# Patient Record
Sex: Female | Born: 1970 | Race: Black or African American | Hispanic: No | Marital: Married | Smoking: Never smoker
Health system: Southern US, Community
[De-identification: ages and names within clinical notes are randomized; demographics above are authoritative.]

## PROBLEM LIST (undated history)

## (undated) DIAGNOSIS — IMO0002 Reserved for concepts with insufficient information to code with codable children: Secondary | ICD-10-CM

## (undated) DIAGNOSIS — I471 Supraventricular tachycardia, unspecified: Secondary | ICD-10-CM

## (undated) DIAGNOSIS — N920 Excessive and frequent menstruation with regular cycle: Secondary | ICD-10-CM

## (undated) DIAGNOSIS — N946 Dysmenorrhea, unspecified: Secondary | ICD-10-CM

## (undated) DIAGNOSIS — R51 Headache: Secondary | ICD-10-CM

## (undated) DIAGNOSIS — I10 Essential (primary) hypertension: Secondary | ICD-10-CM

## (undated) DIAGNOSIS — O021 Missed abortion: Secondary | ICD-10-CM

## (undated) DIAGNOSIS — Z8669 Personal history of other diseases of the nervous system and sense organs: Secondary | ICD-10-CM

## (undated) DIAGNOSIS — K219 Gastro-esophageal reflux disease without esophagitis: Secondary | ICD-10-CM

## (undated) DIAGNOSIS — B019 Varicella without complication: Secondary | ICD-10-CM

## (undated) DIAGNOSIS — D649 Anemia, unspecified: Secondary | ICD-10-CM

## (undated) DIAGNOSIS — R300 Dysuria: Secondary | ICD-10-CM

## (undated) HISTORY — PX: DILATION AND CURETTAGE OF UTERUS: SHX78

## (undated) HISTORY — DX: Dysmenorrhea, unspecified: N94.6

## (undated) HISTORY — DX: Supraventricular tachycardia, unspecified: I47.10

## (undated) HISTORY — DX: Dysuria: R30.0

## (undated) HISTORY — DX: Supraventricular tachycardia: I47.1

## (undated) HISTORY — DX: Varicella without complication: B01.9

## (undated) HISTORY — PX: OTHER SURGICAL HISTORY: SHX169

## (undated) HISTORY — DX: Gastro-esophageal reflux disease without esophagitis: K21.9

## (undated) HISTORY — DX: Anemia, unspecified: D64.9

## (undated) HISTORY — DX: Excessive and frequent menstruation with regular cycle: N92.0

## (undated) HISTORY — PX: UPPER GASTROINTESTINAL ENDOSCOPY: SHX188

## (undated) HISTORY — DX: Personal history of other diseases of the nervous system and sense organs: Z86.69

## (undated) HISTORY — DX: Essential (primary) hypertension: I10

## (undated) HISTORY — PX: BACK SURGERY: SHX140

---
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
## 1988-10-14 DIAGNOSIS — R87619 Unspecified abnormal cytological findings in specimens from cervix uteri: Secondary | ICD-10-CM | POA: Insufficient documentation

## 1990-10-14 HISTORY — PX: APPENDECTOMY: SHX54

## 1998-07-05 ENCOUNTER — Emergency Department (HOSPITAL_COMMUNITY): Admission: EM | Admit: 1998-07-05 | Discharge: 1998-07-05 | Payer: Self-pay | Admitting: Emergency Medicine

## 1998-09-19 ENCOUNTER — Emergency Department (HOSPITAL_COMMUNITY): Admission: EM | Admit: 1998-09-19 | Discharge: 1998-09-20 | Payer: Self-pay | Admitting: Emergency Medicine

## 1998-11-04 ENCOUNTER — Emergency Department (HOSPITAL_COMMUNITY): Admission: EM | Admit: 1998-11-04 | Discharge: 1998-11-04 | Payer: Self-pay | Admitting: Emergency Medicine

## 1999-02-05 ENCOUNTER — Other Ambulatory Visit: Admission: RE | Admit: 1999-02-05 | Discharge: 1999-02-05 | Payer: Self-pay | Admitting: Obstetrics and Gynecology

## 1999-02-17 ENCOUNTER — Encounter: Payer: Self-pay | Admitting: Obstetrics & Gynecology

## 1999-02-17 ENCOUNTER — Ambulatory Visit (HOSPITAL_COMMUNITY): Admission: AD | Admit: 1999-02-17 | Discharge: 1999-02-17 | Payer: Self-pay | Admitting: Obstetrics and Gynecology

## 1999-09-28 ENCOUNTER — Emergency Department (HOSPITAL_COMMUNITY): Admission: EM | Admit: 1999-09-28 | Discharge: 1999-09-28 | Payer: Self-pay | Admitting: Emergency Medicine

## 1999-11-10 ENCOUNTER — Emergency Department (HOSPITAL_COMMUNITY): Admission: EM | Admit: 1999-11-10 | Discharge: 1999-11-10 | Payer: Self-pay | Admitting: Internal Medicine

## 2000-01-14 ENCOUNTER — Encounter: Payer: Self-pay | Admitting: Obstetrics and Gynecology

## 2000-01-14 ENCOUNTER — Encounter: Admission: RE | Admit: 2000-01-14 | Discharge: 2000-01-14 | Payer: Self-pay | Admitting: Obstetrics and Gynecology

## 2000-04-03 ENCOUNTER — Inpatient Hospital Stay (HOSPITAL_COMMUNITY): Admission: AD | Admit: 2000-04-03 | Discharge: 2000-04-03 | Payer: Self-pay | Admitting: Obstetrics and Gynecology

## 2000-05-30 ENCOUNTER — Inpatient Hospital Stay (HOSPITAL_COMMUNITY): Admission: EM | Admit: 2000-05-30 | Discharge: 2000-06-01 | Payer: Self-pay | Admitting: Emergency Medicine

## 2000-05-30 ENCOUNTER — Encounter: Payer: Self-pay | Admitting: Surgery

## 2000-05-30 ENCOUNTER — Encounter: Payer: Self-pay | Admitting: Emergency Medicine

## 2000-05-31 ENCOUNTER — Encounter: Payer: Self-pay | Admitting: Surgery

## 2000-06-20 ENCOUNTER — Other Ambulatory Visit: Admission: RE | Admit: 2000-06-20 | Discharge: 2000-06-20 | Payer: Self-pay | Admitting: Obstetrics & Gynecology

## 2001-01-14 ENCOUNTER — Encounter: Admission: RE | Admit: 2001-01-14 | Discharge: 2001-01-14 | Payer: Self-pay | Admitting: Obstetrics and Gynecology

## 2001-01-14 ENCOUNTER — Encounter: Payer: Self-pay | Admitting: Obstetrics and Gynecology

## 2001-03-10 ENCOUNTER — Encounter: Payer: Self-pay | Admitting: Thoracic Surgery

## 2001-03-10 ENCOUNTER — Encounter: Admission: RE | Admit: 2001-03-10 | Discharge: 2001-03-10 | Payer: Self-pay | Admitting: Thoracic Surgery

## 2001-05-11 ENCOUNTER — Other Ambulatory Visit: Admission: RE | Admit: 2001-05-11 | Discharge: 2001-05-11 | Payer: Self-pay | Admitting: Obstetrics and Gynecology

## 2002-10-13 ENCOUNTER — Encounter: Payer: Self-pay | Admitting: Internal Medicine

## 2002-10-13 LAB — CONVERTED CEMR LAB

## 2002-12-16 ENCOUNTER — Encounter: Admission: RE | Admit: 2002-12-16 | Discharge: 2002-12-16 | Payer: Self-pay | Admitting: Internal Medicine

## 2002-12-16 ENCOUNTER — Encounter: Payer: Self-pay | Admitting: Internal Medicine

## 2003-04-17 ENCOUNTER — Encounter: Payer: Self-pay | Admitting: Emergency Medicine

## 2003-04-17 ENCOUNTER — Emergency Department (HOSPITAL_COMMUNITY): Admission: EM | Admit: 2003-04-17 | Discharge: 2003-04-17 | Payer: Self-pay | Admitting: Emergency Medicine

## 2003-07-29 ENCOUNTER — Emergency Department (HOSPITAL_COMMUNITY): Admission: EM | Admit: 2003-07-29 | Discharge: 2003-07-29 | Payer: Self-pay | Admitting: Emergency Medicine

## 2003-07-29 ENCOUNTER — Encounter: Payer: Self-pay | Admitting: Emergency Medicine

## 2003-08-01 ENCOUNTER — Emergency Department (HOSPITAL_COMMUNITY): Admission: EM | Admit: 2003-08-01 | Discharge: 2003-08-02 | Payer: Self-pay | Admitting: Emergency Medicine

## 2003-08-06 ENCOUNTER — Emergency Department (HOSPITAL_COMMUNITY): Admission: AD | Admit: 2003-08-06 | Discharge: 2003-08-06 | Payer: Self-pay | Admitting: Family Medicine

## 2003-08-13 ENCOUNTER — Emergency Department (HOSPITAL_COMMUNITY): Admission: EM | Admit: 2003-08-13 | Discharge: 2003-08-14 | Payer: Self-pay | Admitting: Emergency Medicine

## 2003-08-14 ENCOUNTER — Emergency Department (HOSPITAL_COMMUNITY): Admission: EM | Admit: 2003-08-14 | Discharge: 2003-08-14 | Payer: Self-pay | Admitting: Emergency Medicine

## 2003-10-16 ENCOUNTER — Emergency Department (HOSPITAL_COMMUNITY): Admission: EM | Admit: 2003-10-16 | Discharge: 2003-10-16 | Payer: Self-pay | Admitting: Emergency Medicine

## 2003-11-22 ENCOUNTER — Emergency Department (HOSPITAL_COMMUNITY): Admission: EM | Admit: 2003-11-22 | Discharge: 2003-11-23 | Payer: Self-pay | Admitting: *Deleted

## 2003-11-26 ENCOUNTER — Emergency Department (HOSPITAL_COMMUNITY): Admission: AD | Admit: 2003-11-26 | Discharge: 2003-11-26 | Payer: Self-pay | Admitting: Family Medicine

## 2003-12-08 ENCOUNTER — Other Ambulatory Visit: Admission: RE | Admit: 2003-12-08 | Discharge: 2003-12-08 | Payer: Self-pay | Admitting: Obstetrics and Gynecology

## 2003-12-13 ENCOUNTER — Emergency Department (HOSPITAL_COMMUNITY): Admission: EM | Admit: 2003-12-13 | Discharge: 2003-12-13 | Payer: Self-pay | Admitting: *Deleted

## 2003-12-16 ENCOUNTER — Emergency Department (HOSPITAL_COMMUNITY): Admission: EM | Admit: 2003-12-16 | Discharge: 2003-12-16 | Payer: Self-pay | Admitting: Emergency Medicine

## 2004-01-13 ENCOUNTER — Observation Stay (HOSPITAL_COMMUNITY): Admission: EM | Admit: 2004-01-13 | Discharge: 2004-01-14 | Payer: Self-pay | Admitting: Emergency Medicine

## 2004-01-15 ENCOUNTER — Emergency Department (HOSPITAL_COMMUNITY): Admission: EM | Admit: 2004-01-15 | Discharge: 2004-01-16 | Payer: Self-pay | Admitting: Emergency Medicine

## 2004-01-26 ENCOUNTER — Ambulatory Visit (HOSPITAL_COMMUNITY): Admission: RE | Admit: 2004-01-26 | Discharge: 2004-01-26 | Payer: Self-pay | Admitting: *Deleted

## 2004-01-26 ENCOUNTER — Encounter: Admission: RE | Admit: 2004-01-26 | Discharge: 2004-01-26 | Payer: Self-pay | Admitting: *Deleted

## 2004-01-26 ENCOUNTER — Encounter (INDEPENDENT_AMBULATORY_CARE_PROVIDER_SITE_OTHER): Payer: Self-pay | Admitting: Specialist

## 2004-03-13 ENCOUNTER — Emergency Department (HOSPITAL_COMMUNITY): Admission: EM | Admit: 2004-03-13 | Discharge: 2004-03-13 | Payer: Self-pay

## 2004-04-29 ENCOUNTER — Emergency Department (HOSPITAL_COMMUNITY): Admission: EM | Admit: 2004-04-29 | Discharge: 2004-04-29 | Payer: Self-pay | Admitting: Family Medicine

## 2004-06-13 ENCOUNTER — Emergency Department (HOSPITAL_COMMUNITY): Admission: EM | Admit: 2004-06-13 | Discharge: 2004-06-13 | Payer: Self-pay | Admitting: Emergency Medicine

## 2005-05-29 IMAGING — CR DG CHEST 2V
2 series · 2 of 2 positions shown · non-contrast
Comparison: 07/29/03.

CLINICAL DATA: Hypertension, weakness, shortness of breath.
 CHEST, TWO VIEWS 11/22/03

[view not recorded (1 of 2)]
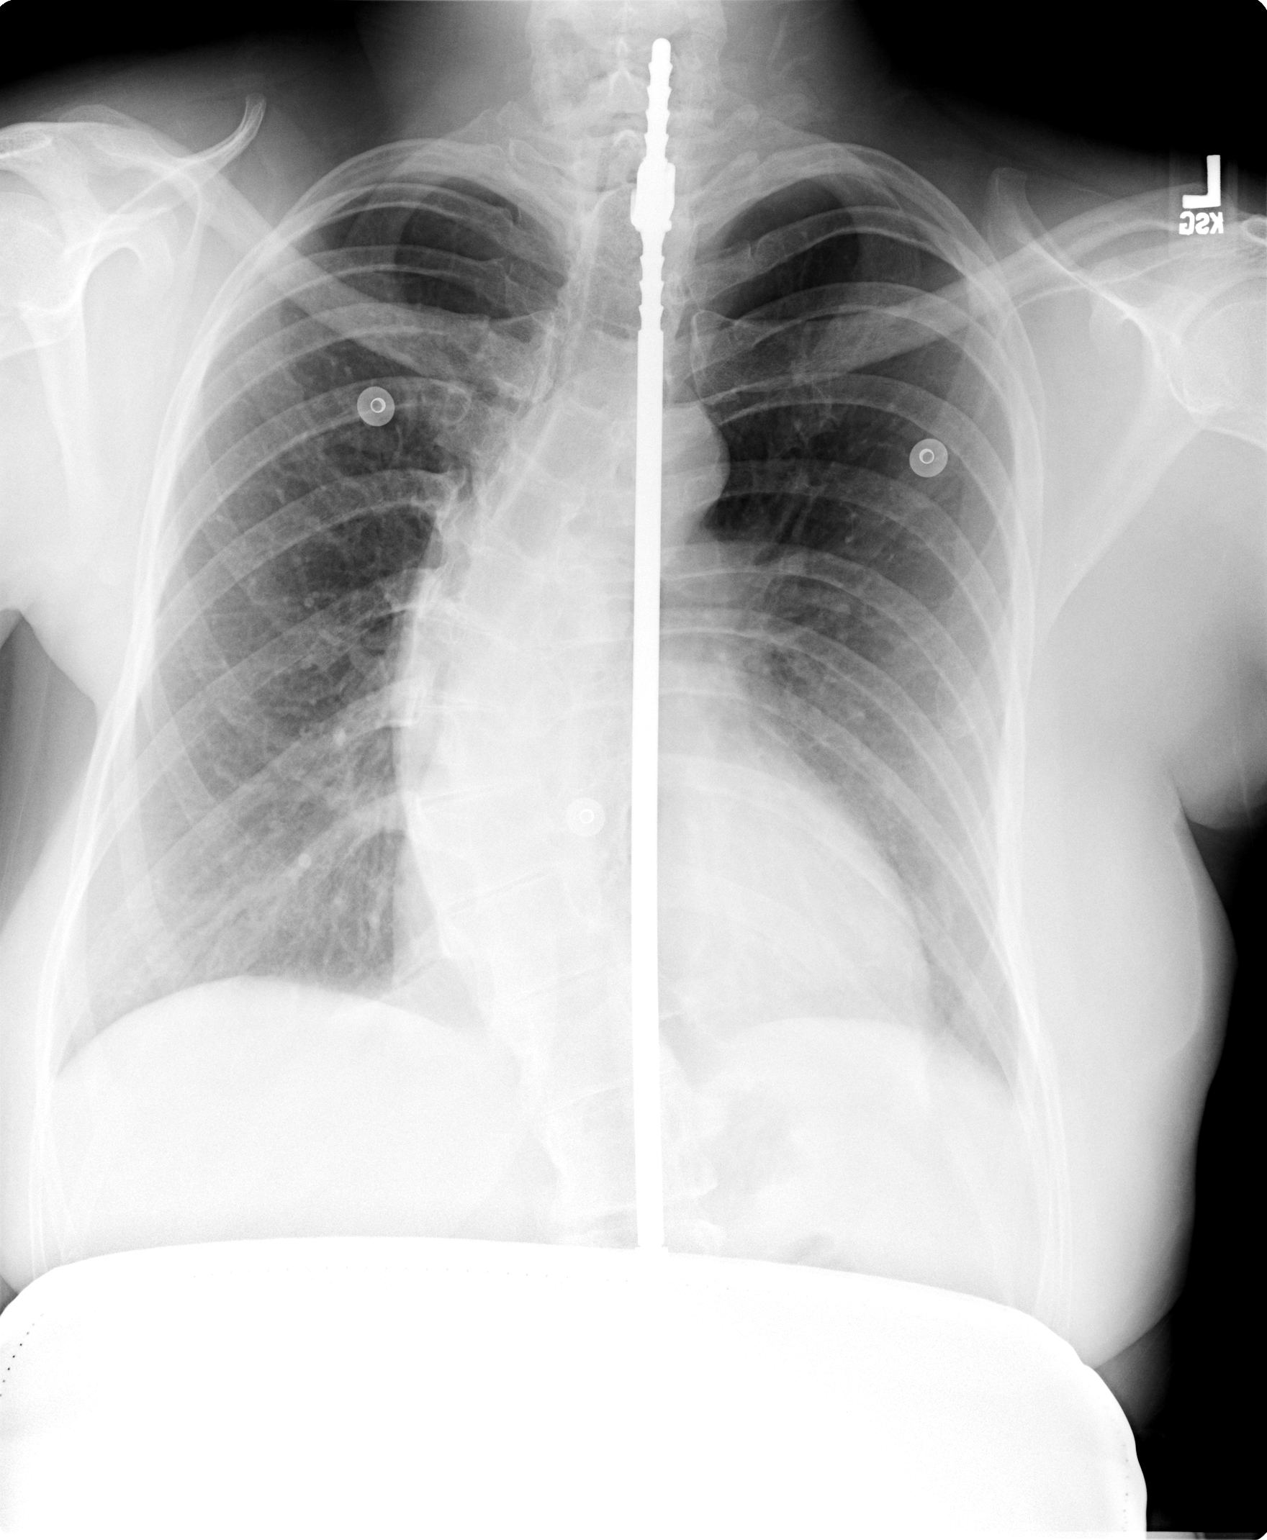

[view not recorded (2 of 2)]
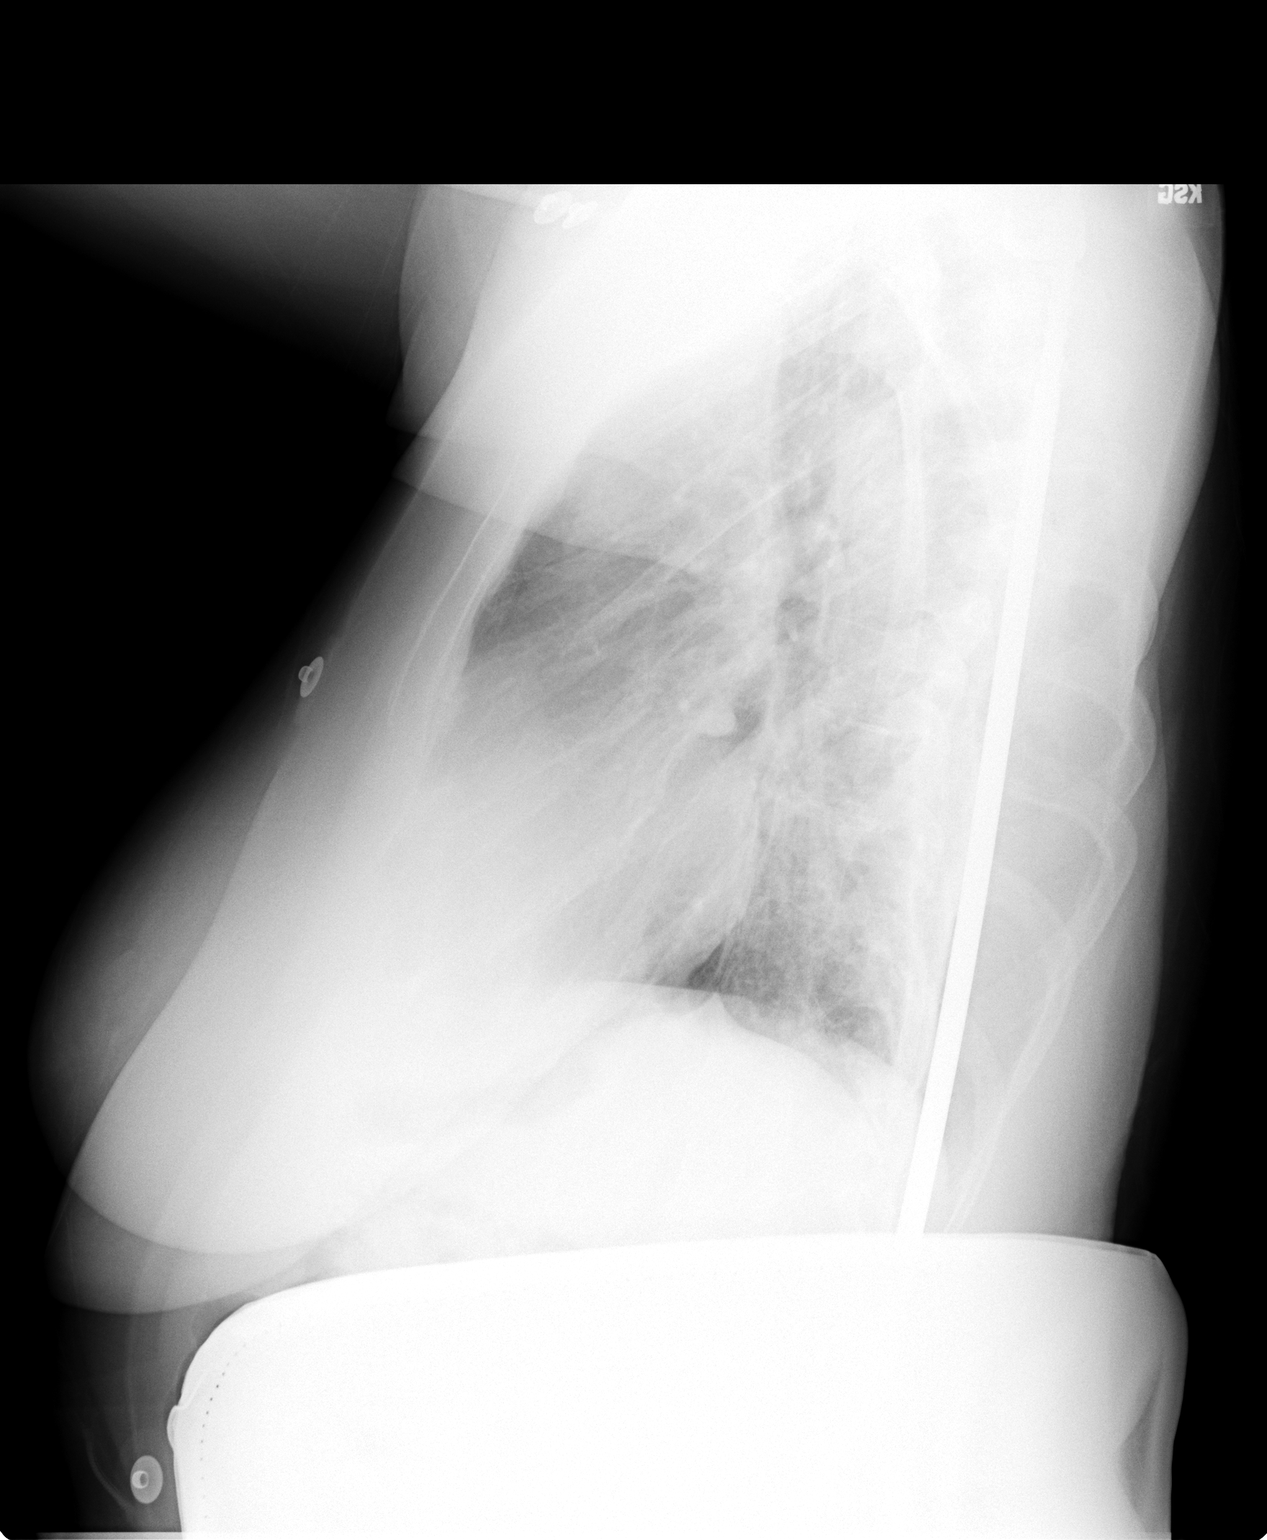

[2 of 2 positions shown; findings below may reference images not displayed]

The heart is mildly enlarged.  There is scoliosis with a posterior spinal rod in place.  No focal airspace opacities or effusions.  
 IMPRESSION
 Mild cardiomegaly.  No acute cardiopulmonary disease.

## 2005-07-17 ENCOUNTER — Other Ambulatory Visit: Admission: RE | Admit: 2005-07-17 | Discharge: 2005-07-17 | Payer: Self-pay | Admitting: Obstetrics and Gynecology

## 2005-10-16 ENCOUNTER — Ambulatory Visit: Payer: Self-pay | Admitting: Internal Medicine

## 2005-10-19 ENCOUNTER — Emergency Department (HOSPITAL_COMMUNITY): Admission: EM | Admit: 2005-10-19 | Discharge: 2005-10-20 | Payer: Self-pay | Admitting: Emergency Medicine

## 2005-10-23 ENCOUNTER — Ambulatory Visit: Payer: Self-pay | Admitting: Internal Medicine

## 2005-11-28 ENCOUNTER — Ambulatory Visit: Payer: Self-pay | Admitting: Internal Medicine

## 2006-07-21 ENCOUNTER — Ambulatory Visit: Payer: Self-pay | Admitting: Internal Medicine

## 2006-07-21 LAB — CONVERTED CEMR LAB: Blood Glucose, Fasting: 92 mg/dL

## 2006-08-14 HISTORY — PX: HERNIA REPAIR: SHX51

## 2006-09-08 ENCOUNTER — Ambulatory Visit (HOSPITAL_BASED_OUTPATIENT_CLINIC_OR_DEPARTMENT_OTHER): Admission: RE | Admit: 2006-09-08 | Discharge: 2006-09-08 | Payer: Self-pay | Admitting: General Surgery

## 2006-09-30 ENCOUNTER — Emergency Department (HOSPITAL_COMMUNITY): Admission: EM | Admit: 2006-09-30 | Discharge: 2006-09-30 | Payer: Self-pay | Admitting: Family Medicine

## 2007-01-29 ENCOUNTER — Ambulatory Visit: Payer: Self-pay | Admitting: Internal Medicine

## 2007-01-29 LAB — CONVERTED CEMR LAB
AST: 19 units/L (ref 0–37)
Albumin: 3.6 g/dL (ref 3.5–5.2)
Basophils Absolute: 0 10*3/uL (ref 0.0–0.1)
Basophils Relative: 0.1 % (ref 0.0–1.0)
Bilirubin Urine: NEGATIVE
Calcium: 9.2 mg/dL (ref 8.4–10.5)
Chloride: 104 meq/L (ref 96–112)
Eosinophils Absolute: 0.1 10*3/uL (ref 0.0–0.6)
GFR calc Af Amer: 121 mL/min
GFR calc non Af Amer: 100 mL/min
Ketones, ur: NEGATIVE mg/dL
LDL Cholesterol: 105 mg/dL — ABNORMAL HIGH (ref 0–99)
Lymphocytes Relative: 31 % (ref 12.0–46.0)
Monocytes Relative: 6.4 % (ref 3.0–11.0)
Neutro Abs: 4.6 10*3/uL (ref 1.4–7.7)
Platelets: 307 10*3/uL (ref 150–400)
Sodium: 137 meq/L (ref 135–145)
Specific Gravity, Urine: 1.02 (ref 1.000–1.03)
TSH: 0.87 microintl units/mL
TSH: 0.87 microintl units/mL (ref 0.35–5.50)
Total CHOL/HDL Ratio: 3
Total Protein, Urine: NEGATIVE mg/dL
Triglycerides: 53 mg/dL (ref 0–149)
Urine Glucose: NEGATIVE mg/dL
VLDL: 11 mg/dL (ref 0–40)
pH: 7 (ref 5.0–8.0)

## 2007-02-03 ENCOUNTER — Ambulatory Visit: Payer: Self-pay | Admitting: Internal Medicine

## 2007-08-05 ENCOUNTER — Encounter: Payer: Self-pay | Admitting: Internal Medicine

## 2007-08-05 DIAGNOSIS — I10 Essential (primary) hypertension: Secondary | ICD-10-CM | POA: Insufficient documentation

## 2007-08-05 DIAGNOSIS — K219 Gastro-esophageal reflux disease without esophagitis: Secondary | ICD-10-CM | POA: Insufficient documentation

## 2007-08-05 DIAGNOSIS — G43909 Migraine, unspecified, not intractable, without status migrainosus: Secondary | ICD-10-CM | POA: Insufficient documentation

## 2007-08-05 DIAGNOSIS — Z9089 Acquired absence of other organs: Secondary | ICD-10-CM | POA: Insufficient documentation

## 2007-12-23 ENCOUNTER — Telehealth (INDEPENDENT_AMBULATORY_CARE_PROVIDER_SITE_OTHER): Payer: Self-pay | Admitting: *Deleted

## 2008-01-14 ENCOUNTER — Ambulatory Visit: Payer: Self-pay | Admitting: Internal Medicine

## 2008-01-14 LAB — CONVERTED CEMR LAB
Basophils Absolute: 0 10*3/uL (ref 0.0–0.1)
Basophils Relative: 0.2 % (ref 0.0–1.0)
CO2: 29 meq/L (ref 19–32)
Chloride: 106 meq/L (ref 96–112)
Creatinine, Ser: 0.7 mg/dL (ref 0.4–1.2)
Eosinophils Absolute: 0.1 10*3/uL (ref 0.0–0.7)
Eosinophils Relative: 1.3 % (ref 0.0–5.0)
GFR calc non Af Amer: 100 mL/min
HCT: 36.1 % (ref 36.0–46.0)
HDL: 65.7 mg/dL (ref >39.0)
LDL Cholesterol: 118 mg/dL — ABNORMAL HIGH (ref 0–99)
Lymphocytes Relative: 43.1 % (ref 12.0–46.0)
MCV: 85.7 fL (ref 78.0–100.0)
Neutrophils Relative %: 48.7 % (ref 43.0–77.0)
Platelets: 362 10*3/uL (ref 150–400)
Potassium: 3.8 meq/L (ref 3.5–5.1)
TSH: 0.93 microintl units/mL (ref 0.35–5.50)
Total CHOL/HDL Ratio: 3
Triglycerides: 68 mg/dL (ref 0–149)
VLDL: 14 mg/dL (ref 0–40)

## 2008-01-16 ENCOUNTER — Emergency Department (HOSPITAL_COMMUNITY): Admission: EM | Admit: 2008-01-16 | Discharge: 2008-01-16 | Payer: Self-pay | Admitting: Emergency Medicine

## 2008-01-20 ENCOUNTER — Encounter: Payer: Self-pay | Admitting: Internal Medicine

## 2008-01-20 ENCOUNTER — Ambulatory Visit: Payer: Self-pay | Admitting: Internal Medicine

## 2008-01-20 DIAGNOSIS — J3089 Other allergic rhinitis: Secondary | ICD-10-CM

## 2008-01-20 DIAGNOSIS — J302 Other seasonal allergic rhinitis: Secondary | ICD-10-CM

## 2009-02-17 ENCOUNTER — Telehealth: Payer: Self-pay | Admitting: Internal Medicine

## 2009-06-05 ENCOUNTER — Ambulatory Visit: Payer: Self-pay | Admitting: Internal Medicine

## 2009-06-05 DIAGNOSIS — J019 Acute sinusitis, unspecified: Secondary | ICD-10-CM

## 2009-06-07 ENCOUNTER — Encounter: Payer: Self-pay | Admitting: Internal Medicine

## 2009-10-30 ENCOUNTER — Encounter: Admission: RE | Admit: 2009-10-30 | Discharge: 2009-10-30 | Payer: Self-pay | Admitting: Obstetrics and Gynecology

## 2010-05-14 ENCOUNTER — Telehealth: Payer: Self-pay | Admitting: Internal Medicine

## 2010-05-17 ENCOUNTER — Ambulatory Visit: Payer: Self-pay | Admitting: Internal Medicine

## 2010-05-17 LAB — CONVERTED CEMR LAB
ALT: 16 units/L (ref 0–35)
Alkaline Phosphatase: 69 units/L (ref 39–117)
BUN: 9 mg/dL (ref 6–23)
Basophils Relative: 0.3 % (ref 0.0–3.0)
Bilirubin, Direct: 0.1 mg/dL (ref 0.0–0.3)
Calcium: 9.4 mg/dL (ref 8.4–10.5)
Cholesterol: 216 mg/dL — ABNORMAL HIGH (ref 0–200)
Eosinophils Relative: 1.7 % (ref 0.0–5.0)
GFR calc non Af Amer: 170.85 mL/min (ref >60)
Glucose, Bld: 83 mg/dL (ref 70–99)
HDL: 67.3 mg/dL (ref >39.00)
Hemoglobin: 10 g/dL — ABNORMAL LOW (ref 12.0–15.0)
Ketones, ur: NEGATIVE mg/dL
Lymphocytes Relative: 39.3 % (ref 12.0–46.0)
MCV: 78.3 fL (ref 78.0–100.0)
Monocytes Absolute: 0.4 10*3/uL (ref 0.1–1.0)
Monocytes Relative: 5.8 % (ref 3.0–12.0)
Neutrophils Relative %: 52.9 % (ref 43.0–77.0)
Platelets: 381 10*3/uL (ref 150.0–400.0)
Potassium: 4 meq/L (ref 3.5–5.1)
RBC: 3.89 M/uL (ref 3.87–5.11)
Sodium: 136 meq/L (ref 135–145)
Total Bilirubin: 0.4 mg/dL (ref 0.3–1.2)
Total Protein, Urine: NEGATIVE mg/dL
Urine Glucose: NEGATIVE mg/dL
Urobilinogen, UA: 0.2 (ref 0.0–1.0)
VLDL: 12.8 mg/dL (ref 0.0–40.0)
pH: 6.5 (ref 5.0–8.0)

## 2010-05-21 ENCOUNTER — Ambulatory Visit: Payer: Self-pay | Admitting: Internal Medicine

## 2010-08-28 ENCOUNTER — Ambulatory Visit: Payer: Self-pay | Admitting: Internal Medicine

## 2010-11-03 ENCOUNTER — Encounter: Payer: Self-pay | Admitting: Internal Medicine

## 2010-11-15 ENCOUNTER — Other Ambulatory Visit: Payer: Self-pay | Admitting: Obstetrics and Gynecology

## 2010-11-15 DIAGNOSIS — Z1239 Encounter for other screening for malignant neoplasm of breast: Secondary | ICD-10-CM

## 2010-11-15 NOTE — Progress Notes (Signed)
Summary: LAB ORDER  Phone Note Call from Patient Call back at [phone]   Summary of Call: Patient is requesting labs prior to next office visit which is Friday.  Please advise.  Initial call taken by: Lamar Sprinkles, CMA,  May 14, 2010 1:29 PM  Follow-up for Phone Call        ok well w/labs v70.0 Follow-up by: Tresa Garter MD,  May 14, 2010 5:51 PM  Additional Follow-up for Phone Call Additional follow up Details #1::        order for labs in IDX. Additional Follow-up by: Verdell Face,  May 15, 2010 8:16 AM    Additional Follow-up for Phone Call Additional follow up Details #2::    Patient notified labs in IDX Follow-up by: Rock Nephew CMA,  May 15, 2010 8:45 AM

## 2010-11-15 NOTE — Assessment & Plan Note (Signed)
Summary: F/U APPT/CD   Vital Signs:  Patient profile:   40 year old female Height:      67 inches Weight:      157 pounds BMI:     24.68 O2 Sat:      98 % on Room air Temp:     98.2 degrees F oral Pulse rate:   86 / minute Pulse rhythm:   regular Resp:     16 per minute BP sitting:   110 / 80  (left arm) Cuff size:   regular  Vitals Entered By: Lanier Prude, CMA(AAMA) (May 21, 2010 11:19 AM)  O2 Flow:  Room air CC: f/u Is Patient Diabetic? No Comments had recent dizziness/hypotension episodes.  Saw provider at her job and Diovan was decreased to 80mg . Pt states symptoms have resolved.   CC:  f/u.  History of Present Illness: F/u HTN She had a low BP x 2 wks and BP was low 90/60 and she cut back Diovan 1/2; feeling well now...  Current Medications (verified): 1)  Low-Dose Aspirin 81 Mg  Tabs (Aspirin) .... One By Mouth Once Daily 2)  Diovan 80 Mg Tabs (Valsartan) .[name].. 1 Once Daily 3)  Spironolactone 25 Mg  Tabs (Spironolactone) .... One By Mouth Once Daily 4)  Cartia Xt 120 Mg  Cp24 (Diltiazem Hcl Coated Beads) .... One By Mouth Once Daily 5)  Tandem Plus 162-115.2-1 Mg  Caps (Fefum-Fepo-Fa-B Cmp-C-Zn-Mn-Cu) .... One By Mouth Once Daily 6)  Amitriptyline Hcl 10 Mg  Tabs (Amitriptyline Hcl) .... 2 By Mouth At Bedtime 7)  Vitamin D3 1000 Unit  Tabs (Cholecalciferol) .[name].. 1 By Mouth Daily 8)  Zegerid Otc 20-1100 Mg Caps (Omeprazole-Sodium Bicarbonate) .[name].. 1 By Mouth Once Daily Prn  Allergies (verified): 1)  ! Vicodin 2)  ! Oxycodone-Acetaminophen (Oxycodone-Acetaminophen)  Past History:  Past Surgical History: Last updated: 01/20/2008 Status post appendectomy in 1992. Status post tonsil and adenoid surgery in 1978  Family History: Last updated: 01/20/2008 Mother age 6 has a history of cerebrovascular accident, hypertension, and diabetes.  Father is age 65, has cerebrovascular accident and hypertension.  Social History: Last updated: 01/20/2008 Occupation:  Museum/gallery exhibitions officer. Married Never Smoked Alcohol use-no  Past Medical History: Hypertension History of Migraine Headache GERD Allergic rhinitis H/o SVT  Review of Systems  The patient denies chest pain.         heavy periods  Physical Exam  General:  alert, well-developed, and well-nourished.   Mouth:  no erythema, no exudates, and postnasal drip.   Lungs:  normal respiratory effort and normal breath sounds.   Heart:  normal rate, regular rhythm, no murmur, and no gallop.   Msk:  No deformity or scoliosis noted of thoracic or lumbar spine.     Impression & Recommendations:  Problem # 1:  HYPERTENSION, CONTROLLED (ICD-401.1) Assessment Comment Only Cont with this regimen: Her updated medication list for this problem includes:    Diovan 80 Mg Tabs (Valsartan) .[name]... 1 once daily    Spironolactone 25 Mg Tabs (Spironolactone) ..... One by mouth once daily    Cartia Xt 120 Mg Cp24 (Diltiazem hcl coated beads) ..... One by mouth once daily  Complete Medication List: 1)  Low-dose Aspirin 81 Mg Tabs (Aspirin) .... One by mouth once daily 2)  Diovan 80 Mg Tabs (Valsartan) .[name].. 1 once daily 3)  Spironolactone 25 Mg Tabs (Spironolactone) .... One by mouth once daily 4)  Cartia Xt 120 Mg Cp24 (Diltiazem hcl coated beads) .... One by  mouth once daily 5)  Tandem Plus 162-115.2-1 Mg Caps (Fefum-fepo-fa-b cmp-c-zn-mn-cu) .... One by mouth once daily 6)  Amitriptyline Hcl 10 Mg Tabs (Amitriptyline hcl) .... 2 by mouth at bedtime 7)  Vitamin D3 1000 Unit Tabs (Cholecalciferol) .[name].. 1 by mouth daily 8)  Zegerid Otc 20-1100 Mg Caps (Omeprazole-sodium bicarbonate) .[name].. 1 by mouth once daily prn 9)  Ferro-bob 325 (65 Fe) Mg Tabs (Ferrous sulfate) .[name].. 1 by mouth once daily for anemia  Patient Instructions: 1)  Normal BP<130/85  2)  Please schedule a follow-up appointment in 6 months. 3)  In 3 months  4)  CBC w/ Diff prior to visit, ICD-9: 5)   iron/tibc Prescriptions: SPIRONOLACTONE 25 MG  TABS (SPIRONOLACTONE) one by mouth once daily  #90 x 3   Entered and Authorized by:   Tresa Garter MD   Signed by:   Tresa Garter MD on 05/21/2010   Method used:   Print then Give to Patient   RxID:   [id number] CARTIA XT 120 MG  CP24 (DILTIAZEM HCL COATED BEADS) one by mouth once daily  #90 x 3   Entered and Authorized by:   Tresa Garter MD   Signed by:   Tresa Garter MD on 05/21/2010   Method used:   Print then Give to Patient   RxID:   [id number] FERRO-BOB 325 (65 FE) MG TABS (FERROUS SULFATE) 1 by mouth once daily for anemia  #90 x 1   Entered and Authorized by:   Tresa Garter MD   Signed by:   Tresa Garter MD on 05/21/2010   Method used:   Print then Give to Patient   RxID:   [id number]

## 2010-11-22 ENCOUNTER — Telehealth: Payer: Self-pay | Admitting: Internal Medicine

## 2010-11-23 ENCOUNTER — Ambulatory Visit: Payer: Self-pay | Admitting: Internal Medicine

## 2010-11-26 ENCOUNTER — Other Ambulatory Visit: Payer: Self-pay

## 2010-11-27 ENCOUNTER — Other Ambulatory Visit: Payer: Self-pay | Admitting: Internal Medicine

## 2010-11-27 ENCOUNTER — Encounter (INDEPENDENT_AMBULATORY_CARE_PROVIDER_SITE_OTHER): Payer: Self-pay | Admitting: *Deleted

## 2010-11-27 ENCOUNTER — Other Ambulatory Visit: Payer: 59

## 2010-11-27 DIAGNOSIS — Z0489 Encounter for examination and observation for other specified reasons: Secondary | ICD-10-CM

## 2010-11-27 LAB — CBC WITH DIFFERENTIAL/PLATELET
Basophils Absolute: 0 10*3/uL (ref 0.0–0.1)
Eosinophils Absolute: 0.1 10*3/uL (ref 0.0–0.7)
Eosinophils Relative: 1.5 % (ref 0.0–5.0)
HCT: 31.3 % — ABNORMAL LOW (ref 36.0–46.0)
Lymphs Abs: 2.8 10*3/uL (ref 0.7–4.0)
MCHC: 32.7 g/dL (ref 30.0–36.0)
MCV: 80.9 fl (ref 78.0–100.0)
Monocytes Absolute: 0.4 10*3/uL (ref 0.1–1.0)
Platelets: 363 10*3/uL (ref 150.0–400.0)
RDW: 16.9 % — ABNORMAL HIGH (ref 11.5–14.6)

## 2010-11-27 LAB — LIPID PANEL
Cholesterol: 200 mg/dL (ref 0–200)
LDL Cholesterol: 119 mg/dL — ABNORMAL HIGH (ref 0–99)
Total CHOL/HDL Ratio: 3

## 2010-11-27 LAB — IBC PANEL
Iron: 46 ug/dL (ref 42–145)
Transferrin: 303.4 mg/dL (ref 212.0–360.0)

## 2010-12-03 ENCOUNTER — Ambulatory Visit
Admission: RE | Admit: 2010-12-03 | Discharge: 2010-12-03 | Disposition: A | Discharge status: Home / Self Care | Payer: 59 | Source: Ambulatory Visit | Attending: Obstetrics and Gynecology | Admitting: Obstetrics and Gynecology

## 2010-12-03 DIAGNOSIS — Z1239 Encounter for other screening for malignant neoplasm of breast: Secondary | ICD-10-CM

## 2010-12-05 NOTE — Progress Notes (Signed)
Summary: lab order  Phone Note Call from Patient Call back at Home Phone [phone]   Caller: Patient Summary of Call: Patient lmovm stating that she will have labs done 11/26/10. She would like to have cholesterol added to order. Please advise Initial call taken by: Rock Nephew CMA,  November 22, 2010 12:46 PM  Follow-up for Phone Call        ok Follow-up by: Tresa Garter MD,  November 22, 2010 1:13 PM  Additional Follow-up for Phone Call Additional follow up Details #1::        Pt informed, please help add to upcomming labs - v070.0 [facility] Additional Follow-up by: Lamar Sprinkles, CMA,  November 22, 2010 6:20 PM    Additional Follow-up for Phone Call Additional follow up Details #2::    Added order to lab in IDX. Follow-up by: Verdell Face,  November 26, 2010 8:26 AM

## 2010-12-14 ENCOUNTER — Ambulatory Visit (INDEPENDENT_AMBULATORY_CARE_PROVIDER_SITE_OTHER): Payer: 59 | Admitting: Internal Medicine

## 2010-12-14 ENCOUNTER — Encounter: Payer: Self-pay | Admitting: Internal Medicine

## 2010-12-14 DIAGNOSIS — D649 Anemia, unspecified: Secondary | ICD-10-CM

## 2010-12-14 DIAGNOSIS — I10 Essential (primary) hypertension: Secondary | ICD-10-CM

## 2010-12-17 DIAGNOSIS — D649 Anemia, unspecified: Secondary | ICD-10-CM | POA: Insufficient documentation

## 2010-12-25 NOTE — Assessment & Plan Note (Signed)
Summary: 6 MOS F/U/CD   Vital Signs:  Patient profile:   40 year old female Height:      67 inches Weight:      151 pounds BMI:     23.74 Temp:     98.6 degrees F oral Pulse rate:   88 / minute Pulse rhythm:   regular Resp:     16 per minute BP sitting:   120 / 90  (left arm) Cuff size:   regular  Vitals Entered By: Lanier Prude, Beverly Gust) (December 14, 2010 3:39 PM) CC: 6 mo f/u  Is Patient Diabetic? No Comments pt is not taking Diovan   CC:  6 mo f/u .  History of Present Illness: F/u HTN and anemia She stopped Iron - too constipated F/u GERD  Current Medications (verified): 1)  Low-Dose Aspirin 81 Mg  Tabs (Aspirin) .... One By Mouth Once Daily 2)  Diovan 80 Mg Tabs (Valsartan) .[name].. 1 Once Daily 3)  Spironolactone 25 Mg  Tabs (Spironolactone) .... One By Mouth Once Daily 4)  Cartia Xt 120 Mg  Cp24 (Diltiazem Hcl Coated Beads) .... One By Mouth Once Daily 5)  Tandem Plus 162-115.2-1 Mg  Caps (Fefum-Fepo-Fa-B Cmp-C-Zn-Mn-Cu) .... One By Mouth Once Daily 6)  Amitriptyline Hcl 10 Mg  Tabs (Amitriptyline Hcl) .... 2 By Mouth At Bedtime 7)  Vitamin D3 1000 Unit  Tabs (Cholecalciferol) .[name].. 1 By Mouth Daily 8)  Zegerid Otc 20-1100 Mg Caps (Omeprazole-Sodium Bicarbonate) .[name].. 1 By Mouth Once Daily Prn 9)  Ferro-Bob 325 (65 Fe) Mg Tabs (Ferrous Sulfate) .[name].. 1 By Mouth Once Daily For Anemia  Allergies (verified): 1)  ! Vicodin 2)  ! Oxycodone-Acetaminophen (Oxycodone-Acetaminophen) 3)  Iron  Past History:  Social History: Last updated: 01/20/2008 Occupation: Museum/gallery exhibitions officer. Married Never Smoked Alcohol use-no  Past Medical History: Hypertension History of Migraine Headache GERD Allergic rhinitis H/o SVT Anemia-NOS  Review of Systems  The patient denies weight loss, chest pain, and dyspnea on exertion.    Physical Exam  General:  alert, well-developed, and well-nourished.   Ears:  External ear exam shows no significant lesions or deformities.   Otoscopic examination reveals clear canals, tympanic membranes are intact bilaterally without bulging, retraction, inflammation or discharge. Hearing is grossly normal bilaterally. Nose:  External nasal examination shows no deformity or inflammation. Nasal mucosa are pink and moist without lesions or exudates. Mouth:  no erythema, no exudates, and postnasal drip.   Lungs:  normal respiratory effort and normal breath sounds.   Heart:  normal rate, regular rhythm, no murmur, and no gallop.   Abdomen:  Bowel sounds positive,abdomen soft and non-tender without masses, organomegaly or hernias noted. Extremities:  no edema B Neurologic:  No cranial nerve deficits noted. Station and gait are normal Skin:  Intact without suspicious lesions or rashes Psych:  Cognition and judgment appear intact. Alert and cooperative with normal attention span and concentration. No apparent delusions, illusions, hallucinations   Impression & Recommendations:  Problem # 1:  HYPERTENSION, CONTROLLED (ICD-401.1) Assessment Unchanged  The following medications were removed from the medication list:    Diovan 80 Mg Tabs (Valsartan) .[name]... 1 once daily Her updated medication list for this problem includes:    Spironolactone 25 Mg Tabs (Spironolactone) ..... One by mouth once daily    Cartia Xt 120 Mg Cp24 (Diltiazem hcl coated beads) ..... One by mouth once daily  BP today: 120/90 Prior BP: 110/80 (05/21/2010)  Labs Reviewed: K+: 4.0 (05/17/2010) Creat: : 0.5 (05/17/2010)  Chol: 200 (11/27/2010)   HDL: 69.70 (11/27/2010)   LDL: 119 (11/27/2010)   TG: 57.0 (11/27/2010)  Problem # 2:  ANEMIA-NOS (ICD-285.9) normocytic Assessment: Unchanged Hem consult was offered The following medications were removed from the medication list:    Tandem Plus 162-115.2-1 Mg Caps (Fefum-fepo-fa-b cmp-c-zn-mn-cu) ..... One by mouth once daily    Ferro-bob 325 (65 Fe) Mg Tabs (Ferrous sulfate) .[name]... 1 by mouth once daily for  anemia  Hgb: 10.2 (11/27/2010)   Hct: 31.3 (11/27/2010)   Platelets: 363.0 (11/27/2010) RBC: 3.87 (11/27/2010)   RDW: 16.9 (11/27/2010)   WBC: 6.8 (11/27/2010) MCV: 80.9 (11/27/2010)   MCHC: 32.7 (11/27/2010) Iron: 46 (11/27/2010)   % Sat: 10.8 (11/27/2010) TSH: 1.49 (05/17/2010)  Problem # 3:  GERD (ICD-530.81) Assessment: Unchanged  Her updated medication list for this problem includes:    Zegerid Otc 20-1100 Mg Caps (Omeprazole-sodium bicarbonate) .[name]... 1 by mouth once daily prn  Complete Medication List: 1)  Low-dose Aspirin 81 Mg Tabs (Aspirin) .... One by mouth once daily 2)  Spironolactone 25 Mg Tabs (Spironolactone) .... One by mouth once daily 3)  Cartia Xt 120 Mg Cp24 (Diltiazem hcl coated beads) .... One by mouth once daily 4)  Amitriptyline Hcl 10 Mg Tabs (Amitriptyline hcl) .... 2 by mouth at bedtime 5)  Vitamin D3 1000 Unit Tabs (Cholecalciferol) .[name].. 1 by mouth daily 6)  Zegerid Otc 20-1100 Mg Caps (Omeprazole-sodium bicarbonate) .[name].. 1 by mouth once daily prn  Patient Instructions: 1)  Please schedule a follow-up appointment in 6 months well w/labs. Prescriptions: AMITRIPTYLINE HCL 10 MG  TABS (AMITRIPTYLINE HCL) 2 by mouth at bedtime  #180 x 3   Entered and Authorized by:   Tresa Garter MD   Signed by:   Tresa Garter MD on 12/14/2010   Method used:   Print then Give to Patient   RxID:   [id number] CARTIA XT 120 MG  CP24 (DILTIAZEM HCL COATED BEADS) one by mouth once daily  #90 x 3   Entered and Authorized by:   Tresa Garter MD   Signed by:   Tresa Garter MD on 12/14/2010   Method used:   Print then Give to Patient   RxID:   [id number] SPIRONOLACTONE 25 MG  TABS (SPIRONOLACTONE) one by mouth once daily  #90 x 3   Entered and Authorized by:   Tresa Garter MD   Signed by:   Tresa Garter MD on 12/14/2010   Method used:   Print then Give to Patient   RxID:   [id number]    Orders Added: 1)  Est. Patient  Level III [01027]
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
## 2011-03-01 NOTE — Discharge Summary (Signed)
NAMEMEDORA, ROORDA                           ACCOUNT NO.:  [id number]   MEDICAL RECORD NO.:  [id number]                   PATIENT TYPE:  INP   LOCATION:  3730                                 FACILITY:  [facility]   PHYSICIAN:  Dani Gobble, MD                    DATE OF BIRTH:  [birth year]   DATE OF ADMISSION:  01/13/2004  DATE OF DISCHARGE:  01/14/2004                                 DISCHARGE SUMMARY   DISCHARGE DIAGNOSES:  1. Chest pain.  Ruled out myocardial infarction by nonspecific EKG changes     and negative cardiac markers, cardiac enzymes.  2. Hypertension.  3. Positive family history of coronary artery disease-premature.  4. History of palpitations, treated with calcium channel blockers.  5. History of transient ischemic attack.  6. History of gastroesophageal reflux disease.  Did not stop taking proton     pump inhibitors to control her indigestion symptoms.   HISTORY:  This is a 40 year old African American female who presented to the  emergency room of [facility] with complaints of chest pain.  She is  a medical patient of Dr. Alexander Mt and was seeing Cristy Hilts. Jacinto Halim, M.D. in our  office for cardiology relation in light of her complaints of palpitations  and chest pain and outpatient Cardiolite stress test was ordered.  She  underwent that test in November, 2004 which showed negative results.   The patient was admitted to the telemetry unit to rule out MI protocol.  Her  enzymes were cycled in two sets of cardiac markers in the emergency room and  her cardiac enzymes on the floor both showed negative results.  EKG did not  show any changes suggestive for possible myocardial infarction.  The patient  continues to have just GI discomfort and she was given GI cocktail and  proton pump inhibitor with relief of pain.   LABORATORY DATA:  Hemoglobin 11.6, hematocrit 34.3, potassium 3.8, BUN 6,  creatinine 0.6.  TSH was within normal limits.  At the time of discharge,  her  fasting lipid profile was still pending.   PHYSICAL EXAMINATION:  VITAL SIGNS:  Stable.  Blood pressure 130/92, heart  rate 78, respiratory 18 and she was afebrile with O2 saturation 96% on room  air.   Madaline Savage, M.D. saw the patient in the morning and then she was  stable for discharge home.  Recommendations were to follow up with cardiac  catheterization at [facility] with Cristy Hilts. Jacinto Halim, M.D.,  considering the history of premature coronary artery disease in the family  and the patients own history of previous transient ischemic attack.   DISCHARGE FOLLOWUP:  Our office will call the patient to set up time and day  for outpatient cardiac catheterization and then she will be followed up with  Dr. Jacinto Halim post catheterization.   DISCHARGE ACTIVITY:  As tolerated.   DISCHARGE  DIET:  Low salt, low cholesterol diet.   DISCHARGE MEDICATIONS:  1. Enteric coated aspirin 81 mg daily.  2. Diovan 150 mg daily.  3. Diltiazem 350 mg daily.  4. Lisinopril 20 mg daily.  5. Aldactone 12.5 mg daily.  6. KCl 40 mEq b.i.d.  7. Protonix 40 mg daily.   Consideration was given to the possibility of the patient having fibromatoid  dysplasia of the renal arteries, that could be a continued component of her  hypertension and renal ultrasound might be considered in outpatient setting  also.      Raymon Mutton, P.A.                    Dani Gobble, MD    MK/MEDQ  D:  01/14/2004  T:  01/15/2004  Job:  [id number]   cc:   [facility]   Cristy Hilts. Jacinto Halim, M.D.  [address]  [place]  [place] [address]  Fax: [phone]
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
## 2011-03-01 NOTE — Op Note (Signed)
[name], [name]               ACCOUNT NO.:  [id number]   MEDICAL RECORD NO.:  [id number]          PATIENT TYPE:  AMB   LOCATION:  DSC                          FACILITY:  [facility]   PHYSICIAN:  Gabrielle Dare. Janee Morn, M.D.DATE OF BIRTH:  26-Feb-1971   DATE OF PROCEDURE:  09/08/2006  DATE OF DISCHARGE:                                 OPERATIVE REPORT   PREOPERATIVE DIAGNOSIS:  Umbilical hernia.   POSTOPERATIVE DIAGNOSIS:  Umbilical hernia.   PROCEDURE:  Repair of umbilical hernia with mesh.   SURGEON:  Gabrielle Dare. Janee Morn, M.D.   ANESTHESIA:  General.   HISTORY OF PRESENT ILLNESS:  [name] is a 40 year old African American  female whom I evaluated in my office for an umbilical hernia.  She presents  for elective repair today.   PROCEDURE IN DETAIL:  Informed consent was obtained. The patient received  intravenous antibiotics.  She was brought to the operating room. General  anesthesia with laryngeal mask airway was administered by the anesthesia  staff.  Her abdomen was prepped and draped in a sterile fashion.  0.25%  percent Marcaine with epinephrine was used to infiltrate the infraumbilical  area. An infraumbilical incision was made.  Subcutaneous tissues were  dissected down to the abdominal fascia.  The umbilicus was then  circumferentially dissected from the skin, with care not to have any skin  injury.  The hernia sac was cleared away from this tissue. It contained  omentum only. It was rather incarcerated. The fascial defect was about 1 cm  in size.  This was circumferentially dissected and cleaned off, allowing  easy reduction of the hernia and contents back into the abdomen.  Hemostasis  on the fascia was ensured.  We then completed the repair with an inlay  polypropylene mesh that I fashioned to give at least 1 cm of circumferential  inlay. This was sutured in with 0 Prolene sutures in interrupted fashion.  This completed the repair.  The area was copiously irrigated.   Meticulous  hemostasis was ensured.  Some additional local anesthetic was injected.  The  umbilicus was reconstructed by tacking the tissues beneath it down to the  underlying fascia with interrupted 2-0 Vicryl sutures.  Hemostasis was again  ensured, and the skin was closed with a running 4-0 Monocryl subcuticular  stitch.  Sponge, needle, and instrument counts were correct.  Benzoin, Steri-  Strips and a sterile cotton ball were placed, as well as a sterile dressing.  The patient tolerated the procedure well, without apparent complication, and  was taken to the recovery in stable condition.      Gabrielle Dare Janee Morn, M.D.  Electronically Signed    BET/MEDQ  D:  09/08/2006  T:  09/08/2006  Job:  [id number]   cc:   Areatha Keas, M.D.

## 2011-04-22 ENCOUNTER — Other Ambulatory Visit: Payer: Self-pay | Admitting: *Deleted

## 2011-04-22 MED ORDER — AMITRIPTYLINE HCL 10 MG PO TABS: 20.0000 mg | ORAL_TABLET | Freq: Every day | ORAL | Status: DC

## 2011-04-22 MED ORDER — DILTIAZEM HCL ER BEADS 120 MG PO CP24: 120.0000 mg | ORAL_CAPSULE | Freq: Every day | ORAL | Status: DC

## 2011-06-18 ENCOUNTER — Other Ambulatory Visit: Payer: Self-pay | Admitting: *Deleted

## 2011-06-18 DIAGNOSIS — Z0000 Encounter for general adult medical examination without abnormal findings: Secondary | ICD-10-CM

## 2011-06-19 ENCOUNTER — Other Ambulatory Visit: Payer: 59

## 2011-06-21 ENCOUNTER — Ambulatory Visit: Payer: 59 | Admitting: Internal Medicine

## 2011-06-21 DIAGNOSIS — Z0289 Encounter for other administrative examinations: Secondary | ICD-10-CM

## 2011-10-17 ENCOUNTER — Telehealth: Payer: Self-pay | Admitting: *Deleted

## 2011-10-17 DIAGNOSIS — Z0000 Encounter for general adult medical examination without abnormal findings: Secondary | ICD-10-CM

## 2011-10-17 NOTE — Telephone Encounter (Signed)
Message copied by Merrilyn Puma on Thu Oct 17, 2011  1:28 PM ------      Message from: Earl Lagos      Created: Wed Oct 16, 2011  1:05 PM      Regarding: labs       Please schedule labs for cpx 11/29/11.  Thanks

## 2011-10-17 NOTE — Telephone Encounter (Signed)
Pt already has future CPE labs pending.

## 2011-10-23 ENCOUNTER — Other Ambulatory Visit: Payer: Self-pay | Admitting: Obstetrics and Gynecology

## 2011-10-25 ENCOUNTER — Encounter (HOSPITAL_COMMUNITY): Payer: Self-pay | Admitting: Pharmacist

## 2011-11-01 ENCOUNTER — Encounter (HOSPITAL_COMMUNITY): Payer: Self-pay

## 2011-11-01 ENCOUNTER — Encounter (HOSPITAL_COMMUNITY)
Admission: RE | Admit: 2011-11-01 | Discharge: 2011-11-01 | Disposition: A | Discharge status: Home / Self Care | Payer: 59 | Source: Ambulatory Visit | Attending: Obstetrics and Gynecology | Admitting: Obstetrics and Gynecology

## 2011-11-01 ENCOUNTER — Other Ambulatory Visit: Payer: Self-pay

## 2011-11-01 HISTORY — DX: Reserved for concepts with insufficient information to code with codable children: IMO0002

## 2011-11-01 HISTORY — DX: Headache: R51

## 2011-11-01 HISTORY — DX: Missed abortion: O02.1

## 2011-11-01 LAB — URINALYSIS, ROUTINE W REFLEX MICROSCOPIC
Bilirubin Urine: NEGATIVE
Ketones, ur: NEGATIVE mg/dL
Leukocytes, UA: NEGATIVE
Nitrite: NEGATIVE
Protein, ur: NEGATIVE mg/dL

## 2011-11-01 LAB — CBC
MCH: 22.5 pg — ABNORMAL LOW (ref 26.0–34.0)
MCHC: 30.1 g/dL (ref 30.0–36.0)
MCV: 74.7 fL — ABNORMAL LOW (ref 78.0–100.0)
Platelets: 433 10*3/uL — ABNORMAL HIGH (ref 150–400)

## 2011-11-01 LAB — BASIC METABOLIC PANEL
Calcium: 9.8 mg/dL (ref 8.4–10.5)
GFR calc Af Amer: >90 mL/min (ref >90)
GFR calc non Af Amer: >90 mL/min (ref >90)
Glucose, Bld: 79 mg/dL (ref 70–99)
Sodium: 140 mEq/L (ref 135–145)

## 2011-11-01 NOTE — Patient Instructions (Addendum)
   Your procedure is scheduled on: Friday, Jan 25th  Enter through the Main Entrance of [facility] at: 745am Pick up the phone at the desk and dial [phone] and inform us of your arrival.  Please call this number if you have any problems the morning of surgery: [phone]  Remember: Do not eat food after midnight:  Thursday Do not drink clear liquids after:  Thursday Take these medicines the morning of surgery with a SIP OF WATER: Prilosec, Spironolactone  Do not wear jewelry, make-up, or FINGER nail polish Do not wear lotions, powders, perfumes or deodorant. Do not shave 48 hours prior to surgery. Do not bring valuables to the hospital. Patients discharged on the day of surgery will not be allowed to drive home.   Home with Husband [name]  cell [phone]  Remember to use your hibiclens as instructed.Please shower with 1/2 bottle the evening before your surgery and the other 1/2 bottle the morning of surgery.

## 2011-11-01 NOTE — H&P (Signed)
[name], [name] NO.:  [id number]  MEDICAL RECORD NO.:  [id number]  LOCATION:  PERIO                         FACILITY:  [facility]  PHYSICIAN:  Janine Limbo, M.D.DATE OF BIRTH:  06-06-1971  DATE OF ADMISSION:  10/23/2011 DATE OF DISCHARGE:                             HISTORY & PHYSICAL   HISTORY OF PRESENT ILLNESS:  [name] is a 41 year old female, para 2- 0-3-2, who presents with menorrhagia and dysmenorrhea.  She wishes to have hysteroscopy with dilatation and curettage.  She also wants to have a NovaSure endometrial ablation.  The patient has been followed at the [facility].  An endometrial biopsy was benign.  Gonorrhea and Chlamydia cultures were negative.  The patient's husband has had a vasectomy.  OBSTETRICAL HISTORY:  The patient has had 2 miscarriages, 1 elective pregnancy termination, and 2 term deliveries.  DRUG ALLERGIES:  OXYCODONE.  PAST MEDICAL HISTORY:  The patient has a history of hypertension and migraine headaches.  She has had gastroesophageal reflux disease in the past.  CURRENT MEDICATIONS:  Spironolactone, Cartia, baby aspirin, Diovan, and fish oil.  She takes naproxen for discomfort and pain.  SURGICAL HISTORY:  The patient has had her appendix and tonsils removed.  SOCIAL HISTORY:  The patient denies cigarette use, alcohol use, and recreational drug use.  REVIEW OF SYSTEMS:  See history of present illness.  FAMILY HISTORY:  Noncontributory.  PHYSICAL EXAMINATION:  VITAL SIGNS:  Height is 5 feet 7 inches, weight is 140 pounds. HEENT:  Within normal limits. CHEST:  Clear. HEART:  Regular rate and rhythm. BREASTS:  Without masses. ABDOMEN:  Nontender. EXTREMITIES:  Grossly normal. NEUROLOGIC:  Grossly normal. PELVIC:  External genitalia is normal.  Vagina is normal.  Cervix is nontender.  Uterus is upper limits normal size.  Adnexa, no  masses.  ASSESSMENT: 1. Menorrhagia 2. Dysmenorrhea.  PLAN:  The patient will undergo hysteroscopy with dilatation and curettage.  She also desires a NovaSure endometrial ablation.  She understands the indications for her surgical procedure as well as the alternative treatment options.  She accepts the risks of, but not limited to, anesthetic complications, bleeding, infections, and possible damage to the surrounding organs.     Janine Limbo, M.D.     AVS/MEDQ  D:  10/31/2011  T:  11/01/2011  Job:  [id number]

## 2011-11-01 NOTE — Pre-Procedure Instructions (Signed)
Ok to see patient DOS. Dr Cristela Blue reviewed EKG.

## 2011-11-08 ENCOUNTER — Encounter (HOSPITAL_COMMUNITY): Payer: Self-pay | Admitting: Anesthesiology

## 2011-11-08 ENCOUNTER — Encounter (HOSPITAL_COMMUNITY): Payer: Self-pay | Admitting: *Deleted

## 2011-11-08 ENCOUNTER — Ambulatory Visit (HOSPITAL_COMMUNITY)
Admission: RE | Admit: 2011-11-08 | Discharge: 2011-11-08 | Disposition: A | Discharge status: Home / Self Care | Payer: 59 | Source: Ambulatory Visit | Attending: Obstetrics and Gynecology | Admitting: Obstetrics and Gynecology

## 2011-11-08 ENCOUNTER — Other Ambulatory Visit: Payer: Self-pay | Admitting: Obstetrics and Gynecology

## 2011-11-08 ENCOUNTER — Ambulatory Visit (HOSPITAL_COMMUNITY): Payer: 59 | Admitting: Anesthesiology

## 2011-11-08 ENCOUNTER — Encounter (HOSPITAL_COMMUNITY)
Admission: RE | Disposition: A | Discharge status: Home / Self Care | Payer: Self-pay | Source: Ambulatory Visit | Attending: Obstetrics and Gynecology

## 2011-11-08 DIAGNOSIS — Z01818 Encounter for other preprocedural examination: Secondary | ICD-10-CM | POA: Insufficient documentation

## 2011-11-08 DIAGNOSIS — Z01812 Encounter for preprocedural laboratory examination: Secondary | ICD-10-CM | POA: Insufficient documentation

## 2011-11-08 DIAGNOSIS — D649 Anemia, unspecified: Secondary | ICD-10-CM | POA: Insufficient documentation

## 2011-11-08 DIAGNOSIS — N946 Dysmenorrhea, unspecified: Secondary | ICD-10-CM | POA: Insufficient documentation

## 2011-11-08 DIAGNOSIS — N920 Excessive and frequent menstruation with regular cycle: Secondary | ICD-10-CM | POA: Insufficient documentation

## 2011-11-08 DIAGNOSIS — I10 Essential (primary) hypertension: Secondary | ICD-10-CM | POA: Insufficient documentation

## 2011-11-08 LAB — URINALYSIS, ROUTINE W REFLEX MICROSCOPIC
Glucose, UA: NEGATIVE mg/dL
Ketones, ur: NEGATIVE mg/dL
Leukocytes, UA: NEGATIVE
Nitrite: NEGATIVE
Specific Gravity, Urine: 1.015 (ref 1.005–1.030)
pH: 8 (ref 5.0–8.0)

## 2011-11-08 LAB — URINE MICROSCOPIC-ADD ON

## 2011-11-08 SURGERY — DILATATION & CURETTAGE/HYSTEROSCOPY WITH NOVASURE ABLATION
Anesthesia: General | Site: Vagina | Wound class: Clean Contaminated

## 2011-11-08 MED ORDER — FENTANYL CITRATE 0.05 MG/ML IJ SOLN
INTRAMUSCULAR | Status: AC
  Administered 2011-11-08: 25 ug via INTRAVENOUS
  Filled 2011-11-08: qty 2

## 2011-11-08 MED ORDER — LACTATED RINGERS IV SOLN
INTRAVENOUS | Status: DC
  Administered 2011-11-08 (×2): via INTRAVENOUS

## 2011-11-08 MED ORDER — MIDAZOLAM HCL 5 MG/5ML IJ SOLN
INTRAMUSCULAR | Status: DC | PRN
  Administered 2011-11-08 (×2): 2 mg via INTRAVENOUS

## 2011-11-08 MED ORDER — MIDAZOLAM HCL 2 MG/2ML IJ SOLN
INTRAMUSCULAR | Status: AC
  Filled 2011-11-08: qty 2

## 2011-11-08 MED ORDER — ONDANSETRON HCL 4 MG/2ML IJ SOLN
INTRAMUSCULAR | Status: DC | PRN
  Administered 2011-11-08: 4 mg via INTRAVENOUS

## 2011-11-08 MED ORDER — BUPIVACAINE-EPINEPHRINE 0.5% -1:200000 IJ SOLN
INTRAMUSCULAR | Status: DC | PRN
  Administered 2011-11-08: 10 mL

## 2011-11-08 MED ORDER — GLYCOPYRROLATE 0.2 MG/ML IJ SOLN
INTRAMUSCULAR | Status: DC | PRN
  Administered 2011-11-08: 0.1 mg via INTRAVENOUS

## 2011-11-08 MED ORDER — PROMETHAZINE HCL 12.5 MG PO TABS: 12.5000 mg | ORAL_TABLET | Freq: Four times a day (QID) | ORAL | Status: DC | PRN

## 2011-11-08 MED ORDER — IBUPROFEN 200 MG PO TABS: 800.0000 mg | ORAL_TABLET | Freq: Three times a day (TID) | ORAL | Status: AC | PRN

## 2011-11-08 MED ORDER — LACTATED RINGERS IV SOLN
INTRAVENOUS | Status: DC | PRN
  Administered 2011-11-08: 3000 mL via INTRAUTERINE

## 2011-11-08 MED ORDER — FENTANYL CITRATE 0.05 MG/ML IJ SOLN
INTRAMUSCULAR | Status: AC
  Filled 2011-11-08: qty 2

## 2011-11-08 MED ORDER — KETOROLAC TROMETHAMINE 30 MG/ML IJ SOLN
INTRAMUSCULAR | Status: DC | PRN
  Administered 2011-11-08: 30 mg via INTRAVENOUS
  Administered 2011-11-08: 30 mg via INTRAMUSCULAR

## 2011-11-08 MED ORDER — FENTANYL CITRATE 0.05 MG/ML IJ SOLN
INTRAMUSCULAR | Status: DC | PRN
  Administered 2011-11-08 (×2): 50 ug via INTRAVENOUS

## 2011-11-08 MED ORDER — PROPOFOL 10 MG/ML IV EMUL
INTRAVENOUS | Status: DC | PRN
  Administered 2011-11-08 (×2): 100 mg via INTRAVENOUS
  Administered 2011-11-08: 200 mg via INTRAVENOUS

## 2011-11-08 MED ORDER — DEXAMETHASONE SODIUM PHOSPHATE 4 MG/ML IJ SOLN
INTRAMUSCULAR | Status: DC | PRN
  Administered 2011-11-08: 6 mg via INTRAVENOUS

## 2011-11-08 MED ORDER — BUPIVACAINE-EPINEPHRINE (PF) 0.5% -1:200000 IJ SOLN
INTRAMUSCULAR | Status: AC
  Filled 2011-11-08: qty 10

## 2011-11-08 MED ORDER — HYDROCODONE-ACETAMINOPHEN 5-500 MG PO TABS: 1.0000 | ORAL_TABLET | ORAL | Status: DC | PRN

## 2011-11-08 MED ORDER — FENTANYL CITRATE 0.05 MG/ML IJ SOLN
25.0000 ug | Freq: Once | INTRAMUSCULAR | Status: AC
  Administered 2011-11-08: 25 ug via INTRAVENOUS

## 2011-11-08 SURGICAL SUPPLY — 15 items
CANISTER SUCTION 2500CC (MISCELLANEOUS) ×2 IMPLANT
CATH ROBINSON RED A/P 16FR (CATHETERS) ×2 IMPLANT
CLOTH BEACON ORANGE TIMEOUT ST (SAFETY) ×2 IMPLANT
CONTAINER PREFILL 10% NBF 60ML (FORM) ×4 IMPLANT
ELECT REM PT RETURN 9FT ADLT (ELECTROSURGICAL)
ELECTRODE REM PT RTRN 9FT ADLT (ELECTROSURGICAL) IMPLANT
GLOVE BIOGEL PI IND STRL 8.5 (GLOVE) ×1 IMPLANT
GLOVE BIOGEL PI INDICATOR 8.5 (GLOVE) ×1
GLOVE ECLIPSE 8.0 STRL XLNG CF (GLOVE) ×4 IMPLANT
GOWN PREVENTION PLUS LG XLONG (DISPOSABLE) ×2 IMPLANT
GOWN STRL REIN XL XLG (GOWN DISPOSABLE) ×2 IMPLANT
LOOP ANGLED CUTTING 22FR (CUTTING LOOP) IMPLANT
PACK HYSTEROSCOPY LF (CUSTOM PROCEDURE TRAY) ×2 IMPLANT
TOWEL OR 17X24 6PK STRL BLUE (TOWEL DISPOSABLE) ×4 IMPLANT
WATER STERILE IRR 1000ML POUR (IV SOLUTION) ×2 IMPLANT

## 2011-11-08 NOTE — Preoperative (Signed)
Beta Blockers   Reason not to administer Beta Blockers:Not Applicable 

## 2011-11-08 NOTE — H&P (Signed)
The patient was interviewed and examined today.  The previously documented history and physical examination was reviewed. There are no changes. The operative procedure was reviewed. The risks and benefits were outlined again. The specific risks include, but are not limited to, anesthetic complications, bleeding, infections, and possible damage to the surrounding organs. The patient's questions were answered.  We are ready to proceed as outlined. The likelihood of the patient achieving the goals of this procedure is very likely.   [name], M.D.  

## 2011-11-08 NOTE — Anesthesia Preprocedure Evaluation (Addendum)
Anesthesia Evaluation  Patient identified by MRN, date of birth, ID band Patient awake    Reviewed: Allergy & Precautions, H&P , Patient's Chart, lab work & pertinent test results, reviewed documented beta blocker date and time   Airway Mallampati: II TM Distance: >3 FB Neck ROM: full    Dental No notable dental hx.    Pulmonary  clear to auscultation  Pulmonary exam normal       Cardiovascular hypertension, Pt. on medications + dysrhythmias (Rx'd with Ca+2 blocker- No sx) Supra Ventricular Tachycardia regular Normal    Neuro/Psych    GI/Hepatic GERD-  Medicated and Controlled,  Endo/Other    Renal/GU      Musculoskeletal   Abdominal   Peds  Hematology   Anesthesia Other Findings   Reproductive/Obstetrics                          Anesthesia Physical Anesthesia Plan  ASA: II  Anesthesia Plan: General   Post-op Pain Management:    Induction: Intravenous  Airway Management Planned: LMA  Additional Equipment:   Intra-op Plan:   Post-operative Plan:   Informed Consent: I have reviewed the patients History and Physical, chart, labs and discussed the procedure including the risks, benefits and alternatives for the proposed anesthesia with the patient or authorized representative who has indicated his/her understanding and acceptance.   Dental Advisory Given  Plan Discussed with: CRNA and Surgeon  Anesthesia Plan Comments: (  Discussed  general anesthesia, including possible nausea, instrumentation of airway, sore throat,pulmonary aspiration, etc. I asked if the were any outstanding questions, or  concerns before we proceeded. )        Anesthesia Quick Evaluation

## 2011-11-08 NOTE — Op Note (Signed)
OPERATIVE NOTE  [name]  DOB:    1971/03/30  MRN:    [id number]  CSN:    [id number]  Date of Surgery:  11/08/2011  Preoperative Diagnosis:  Menorrhagia  Dysmenorrhea  Anemia (hemoglobin is 8.8)  Postoperative Diagnosis:  Same  Procedure:  Hysteroscopy  Dilatation and curettage  NovaSure ablation of the endometrium  Surgeon:  Leonard Schwartz, M.D.  Assistant:  None  Anesthetic:  General  Disposition:  The patient is a 41 year old female, para 2-0-3-2, who presents with menorrhagia, dysmenorrhea, and anemia. Her husband had a vasectomy. She understands the indications for surgical procedure and the alternative treatment options. She accepts the risk of, but not limited to, anesthetic complications, bleeding, infections, and possible damage to the surrounding organs.  Findings:  The uterus is upper limits normal size. No adnexal masses were appreciated. No pathology was seen on hysteroscopy.  Procedure:  The patient was taken to the operating room where a general anesthetic was given. A LMA was placed. There was a leakage in the line and the LMA had to be replaced. The patient's perineum and vagina were prepped with multiple layers of Betadine. The bladder was catheterized. A sample was sent for urine culture. The patient was sterilely draped. An examination under anesthesia was performed. A paracervical block was placed using 10 cc of half percent Marcaine with epinephrine. An endocervical curettage was performed. The cervix was measured and was noted to be 2.75 cm. The uterus was sounded and the total length was noted to be 8.75 cm. The cervix was gently dilated. The diagnostic hysteroscope was inserted and the cavity was carefully inspected. No lesions were seen. The hysteroscope was removed. The cavity was then curetted using a medium sharp curet. The cavity was noted to be clean at the end of our procedure. Pictures were taken. The NovaSure  instrument was tested and was noted to function properly. The NovaSure instrument was inserted into the uterus. The cavity width was noted to be 3.5 cm. A test was performed and the cavity was noted to be intact. The endometrium was then ablated for total of 63 seconds. The NovaSure Stefano Gaul was removed. The hysteroscope was reinserted and the cavity was thought to be completely ablated. On sutures were then removed. Hemostasis was adequate. Her examination was repeated. The uterus was noted to be firm. The patient was awakened from her anesthetic without difficulty. She was transported to the recovery room in stable condition. She tolerated her procedure well. The estimated blood loss was 5 cc. The estimated fluid deficit was 105 cc. Sponge and needle counts were correct. The endocervical curettings and the endometrial curettings were sent to pathology.  Followup instructions:  The patient will return to see Dr. Stefano Gaul in 2 weeks for followup examination. She was given prescriptions for Motrin, Vicodin, and Phenergan. She will call for questions or concerns.  Leonard Schwartz, M.D.

## 2011-11-08 NOTE — Transfer of Care (Signed)
Immediate Anesthesia Transfer of Care Note  Patient: [name]  Procedure(s) Performed:  DILATATION & CURETTAGE/HYSTEROSCOPY WITH NOVASURE ABLATION  Patient Location: PACU  Anesthesia Type: General  Level of Consciousness: awake, oriented and sedated  Airway & Oxygen Therapy: Patient Spontanous Breathing and Patient connected to nasal cannula oxygen  Post-op Assessment: Report given to PACU RN and Post -op Vital signs reviewed and stable  Post vital signs: Reviewed and stable  Complications: No apparent anesthesia complications

## 2011-11-09 LAB — URINE CULTURE: Culture  Setup Time: 201301251450

## 2011-11-14 NOTE — Anesthesia Postprocedure Evaluation (Signed)
Anesthesia Post Note  Patient: [name]  Procedure(s) Performed:  DILATATION & CURETTAGE/HYSTEROSCOPY WITH NOVASURE ABLATION  Anesthesia type: General  Patient location: PACU  Post pain: Pain level controlled  Post assessment: Post-op Vital signs reviewed  Last Vitals:  Filed Vitals:   11/08/11 1120  BP:   Pulse: 72  Temp: 36.7 C  Resp: 20    Post vital signs: Reviewed  Level of consciousness: sedated  Complications: No apparent anesthesia complications

## 2011-11-20 ENCOUNTER — Other Ambulatory Visit (INDEPENDENT_AMBULATORY_CARE_PROVIDER_SITE_OTHER): Payer: 59

## 2011-11-20 ENCOUNTER — Telehealth: Payer: Self-pay | Admitting: Internal Medicine

## 2011-11-20 DIAGNOSIS — Z0000 Encounter for general adult medical examination without abnormal findings: Secondary | ICD-10-CM

## 2011-11-20 LAB — BASIC METABOLIC PANEL
CO2: 27 mEq/L (ref 19–32)
Calcium: 9.1 mg/dL (ref 8.4–10.5)
Potassium: 3.2 mEq/L — ABNORMAL LOW (ref 3.5–5.1)
Sodium: 140 mEq/L (ref 135–145)

## 2011-11-20 LAB — HEPATIC FUNCTION PANEL
AST: 16 U/L (ref 0–37)
Alkaline Phosphatase: 57 U/L (ref 39–117)
Bilirubin, Direct: 0 mg/dL (ref 0.0–0.3)
Total Protein: 7.4 g/dL (ref 6.0–8.3)

## 2011-11-20 NOTE — Telephone Encounter (Signed)
[name], please, inform patient that all labs are normal except for low K. Take otc potassium 1 a day Keep ROV Thx

## 2011-11-21 NOTE — Telephone Encounter (Signed)
Pt informed

## 2011-11-29 ENCOUNTER — Encounter: Payer: Self-pay | Admitting: Internal Medicine

## 2011-11-29 ENCOUNTER — Ambulatory Visit (INDEPENDENT_AMBULATORY_CARE_PROVIDER_SITE_OTHER): Payer: 59 | Admitting: Internal Medicine

## 2011-11-29 VITALS — BP 112/80 | HR 88 | Temp 98.6°F | Resp 16 | Ht 67.0 in | Wt 141.0 lb

## 2011-11-29 DIAGNOSIS — Z0000 Encounter for general adult medical examination without abnormal findings: Secondary | ICD-10-CM

## 2011-11-29 DIAGNOSIS — E876 Hypokalemia: Secondary | ICD-10-CM | POA: Insufficient documentation

## 2011-11-29 DIAGNOSIS — I10 Essential (primary) hypertension: Secondary | ICD-10-CM

## 2011-11-29 DIAGNOSIS — D649 Anemia, unspecified: Secondary | ICD-10-CM

## 2011-11-29 DIAGNOSIS — J309 Allergic rhinitis, unspecified: Secondary | ICD-10-CM

## 2011-11-29 MED ORDER — SPIRONOLACTONE 25 MG PO TABS: 25.0000 mg | ORAL_TABLET | Freq: Every day | ORAL | Status: DC

## 2011-11-29 MED ORDER — AMITRIPTYLINE HCL 10 MG PO TABS: 20.0000 mg | ORAL_TABLET | Freq: Every evening | ORAL | Status: DC | PRN

## 2011-11-29 MED ORDER — FERROUS SULFATE 325 (65 FE) MG PO TABS: 325.0000 mg | ORAL_TABLET | Freq: Every day | ORAL | Status: DC

## 2011-11-29 MED ORDER — DILTIAZEM HCL ER BEADS 120 MG PO CP24: 120.0000 mg | ORAL_CAPSULE | Freq: Every day | ORAL | Status: DC

## 2011-11-29 NOTE — Assessment & Plan Note (Signed)
Continue with current prescription therapy as reflected on the Med list.  

## 2011-11-29 NOTE — Assessment & Plan Note (Signed)
On po iron

## 2011-11-29 NOTE — Progress Notes (Signed)
  Subjective:    Patient ID: [name], female    DOB: November 19, 1970, 41 y.o.   MRN: [id number]  HPI  The patient is here for a wellness exam. The patient has been doing well overall without major physical or psychological issues going on lately. The patient needs to address  chronic hypertension that has been well controlled with medicines; to address chronic anemia and her recent low KReview of Systems  Constitutional: Negative for fever, chills, diaphoresis, activity change, appetite change, fatigue and unexpected weight change.  HENT: Negative for hearing loss, ear pain, congestion, sore throat, sneezing, mouth sores, neck pain, dental problem, voice change, postnasal drip and sinus pressure.   Eyes: Negative for pain and visual disturbance.  Respiratory: Negative for cough, chest tightness, wheezing and stridor.   Cardiovascular: Negative for chest pain, palpitations and leg swelling.  Gastrointestinal: Negative for nausea, vomiting, abdominal pain, blood in stool, abdominal distention and rectal pain.  Genitourinary: Negative for dysuria, hematuria, decreased urine volume, vaginal bleeding, vaginal discharge, difficulty urinating, vaginal pain and menstrual problem.  Musculoskeletal: Negative for back pain, joint swelling and gait problem.  Skin: Negative for color change, rash and wound.  Neurological: Negative for dizziness, tremors, syncope, speech difficulty and light-headedness.  Hematological: Negative for adenopathy.  Psychiatric/Behavioral: Negative for suicidal ideas, hallucinations, behavioral problems, confusion, sleep disturbance, dysphoric mood and decreased concentration. The patient is not hyperactive.        Objective:   Physical Exam  Constitutional: She appears well-developed. No distress.  HENT:  Head: Normocephalic.  Right Ear: External ear normal.  Left Ear: External ear normal.  Nose: Nose normal.  Mouth/Throat: Oropharynx is clear and moist.  Eyes:  Conjunctivae are normal. Pupils are equal, round, and reactive to light. Right eye exhibits no discharge. Left eye exhibits no discharge.  Neck: Normal range of motion. Neck supple. No JVD present. No tracheal deviation present. No thyromegaly present.  Cardiovascular: Normal rate, regular rhythm and normal heart sounds.   Pulmonary/Chest: No stridor. No respiratory distress. She has no wheezes.  Abdominal: Soft. Bowel sounds are normal. She exhibits no distension and no mass. There is no tenderness. There is no rebound and no guarding.  Musculoskeletal: She exhibits no edema and no tenderness.  Lymphadenopathy:    She has no cervical adenopathy.  Neurological: She displays normal reflexes. No cranial nerve deficit. She exhibits normal muscle tone. Coordination normal.  Skin: No rash noted. No erythema.  Psychiatric: She has a normal mood and affect. Her behavior is normal. Judgment and thought content normal.          Assessment & Plan:

## 2011-12-01 NOTE — Assessment & Plan Note (Signed)
Given Rx Re-check in a month

## 2011-12-01 NOTE — Assessment & Plan Note (Signed)
We discussed age appropriate health related issues, including available/recomended screening tests and vaccinations. We discussed a need for adhering to healthy diet and exercise. Labs/EKG were reviewed/ordered. All questions were answered.   

## 2011-12-23 DIAGNOSIS — Z9889 Other specified postprocedural states: Secondary | ICD-10-CM | POA: Insufficient documentation

## 2011-12-23 DIAGNOSIS — R300 Dysuria: Secondary | ICD-10-CM | POA: Insufficient documentation

## 2011-12-31 ENCOUNTER — Other Ambulatory Visit: Payer: Self-pay | Admitting: Obstetrics and Gynecology

## 2011-12-31 DIAGNOSIS — Z1231 Encounter for screening mammogram for malignant neoplasm of breast: Secondary | ICD-10-CM

## 2012-01-06 ENCOUNTER — Ambulatory Visit
Admission: RE | Admit: 2012-01-06 | Discharge: 2012-01-06 | Disposition: A | Discharge status: Home / Self Care | Payer: 59 | Source: Ambulatory Visit | Attending: Obstetrics and Gynecology | Admitting: Obstetrics and Gynecology

## 2012-01-06 DIAGNOSIS — Z1231 Encounter for screening mammogram for malignant neoplasm of breast: Secondary | ICD-10-CM

## 2012-01-18 ENCOUNTER — Ambulatory Visit (INDEPENDENT_AMBULATORY_CARE_PROVIDER_SITE_OTHER): Payer: 59 | Admitting: Family Medicine

## 2012-01-18 VITALS — BP 138/92 | Temp 98.3°F | Wt 141.0 lb

## 2012-01-18 DIAGNOSIS — J019 Acute sinusitis, unspecified: Secondary | ICD-10-CM

## 2012-01-18 DIAGNOSIS — I10 Essential (primary) hypertension: Secondary | ICD-10-CM

## 2012-01-18 MED ORDER — DOXYCYCLINE MONOHYDRATE 100 MG PO CAPS: 100.0000 mg | ORAL_CAPSULE | Freq: Two times a day (BID) | ORAL | Status: AC

## 2012-01-18 NOTE — Patient Instructions (Signed)
This is a sinus infection Start the Doxycycline twice daily w/ food Continue the nasonex Add claritin or zyrtec daily Drink plenty of fluids Start checking your BP once you are feeling better- if BP still high, schedule f/u w/ Dr Posey Rea to assess and change meds Call with any questions or concerns Hang in there!

## 2012-01-18 NOTE — Assessment & Plan Note (Signed)
Deteriorated.  Most likely due to current illness and stress response.  Pt to check BP again once feeling better and if BP remains high, schedule a f/u w/ PCP.  Pt expressed understanding and is in agreement w/ plan.

## 2012-01-18 NOTE — Progress Notes (Signed)
  Subjective:    Patient ID: [name], female    DOB: June 01, 1971, 41 y.o.   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  Earlier that day was 142/90.  Reports she is taking the Dilt and Aldactone.  Previously on Diovan but BP had improved so this was stopped.    Facial pain/ear pain-  sxs started >1 week ago.  Was started on Avelox.  Took 4 days of meds.  Saw MD at work and was given 6 more days.  Still w/ facial pain/pressure.  Bilateral ear pain.  No fevers.   Review of Systems For ROS see HPI     Objective:   Physical Exam  Vitals reviewed. Constitutional: She is oriented to person, place, and time. She appears well-developed and well-nourished. No distress.  HENT:  Head: Normocephalic and atraumatic.  Right Ear: Tympanic membrane normal.  Left Ear: Tympanic membrane normal.  Nose: Mucosal edema and rhinorrhea present. Right sinus exhibits maxillary sinus tenderness and frontal sinus tenderness. Left sinus exhibits maxillary sinus tenderness and frontal sinus tenderness.  Mouth/Throat: Uvula is midline and mucous membranes are normal. Posterior oropharyngeal erythema present. No oropharyngeal exudate.  Eyes: Conjunctivae and EOM are normal. Pupils are equal, round, and reactive to light.  Neck: Normal range of motion. Neck supple.  Cardiovascular: Normal rate, regular rhythm and normal heart sounds.   Pulmonary/Chest: Effort normal and breath sounds normal. No respiratory distress. She has no wheezes.  Musculoskeletal: She exhibits no edema.  Lymphadenopathy:    She has no cervical adenopathy.  Neurological: She is alert and oriented to person, place, and time.          Assessment & Plan:

## 2012-01-18 NOTE — Assessment & Plan Note (Signed)
Pt's sxs and PE consistent w/ infxn.  Pt has had fragmented tx w/ Avelox.  Start course of doxy for complete coverage.  Reviewed supportive care and red flags that should prompt return.  Pt expressed understanding and is in agreement w/ plan.

## 2012-01-20 ENCOUNTER — Encounter: Payer: Self-pay | Admitting: Obstetrics and Gynecology

## 2012-01-20 ENCOUNTER — Telehealth: Payer: Self-pay

## 2012-01-20 NOTE — Telephone Encounter (Signed)
[facility] Triage Call Report Triage Record Num: [id number] Operator: Alphonsa Overall Patient Name: [name] Call Date & Time: 01/17/2012 7:16:55PM Patient Phone: [phone] PCP: Sonda Primes Patient Gender: Female PCP Fax : [phone] Patient DOB: 12/02/1970 [facility] Name[facility] Reason for Call: Caller: Marcos/Patient; PCP: Sonda Primes; CB#: [phone]; Call regarding Headache Jason calling about HTN. Feeling poorly all week. Headache. Pt on abx 01/14/12 for sinuses-not better. 114/84 BP this am. 139/93 at 1921. No recent changes in medications. Higher readings all week with headache. Home care advice given. Appt 01/18/12 at 0915 with Dr Beverely Low at [facility]) Used: Hypertension, Diagnosed or Suspected Recommended Outcome per Protocol: See Provider within 72 Hours Reason for Outcome: Multiple elevated blood pressure readings without other symptoms AND no previous work-up OR readings exceed expected range defined by treatment plan Care Advice: It is important to have blood pressure checked at least annually, or at each provider visit, especially if you have a history of high blood pressure in your family. ~ ~ Call provider if systolic BP is 180 or greater, or if diastolic BP is 120 or greater. ~ HEALTH PROMOTION / MAINTENANCE Avoid the use of stimulants including caffeine (coffee, some soft drinks, some energy drinks, tea and chocolate), cocaine, and amphetamines. Also avoid drinking alcohol. ~ ~ List, or take, all current prescription(s), nonprescription or alternative medication(s) to provider for evaluation. LIFESTYLE MODIFICATION FOR HYPERTENSION: - Weight reduction will help lower blood pressure in individuals who are 10% or more above their ideal body weight. - Eat foods low in saturated fat and sugar, and high in complex carbohydrates (vegetables, fruits, whole grains). - Drink alcohol only in moderate amounts (12 oz beer, 5 oz wine, or 1 1/2 oz of  distilled alcohol such as vodka, gin, etc.) and not more than 2 drinks/day for men or 1 drink/day for women or lighter-weight persons. - Get at least 30 minutes of moderate aerobic exercise (using as much energy as walking 2 miles in 30 minutes) most days of the week, preferably daily. - Stop smoking to decrease risk for cardiovascular and pulmonary disease, as well as cancer. - Keep regularly scheduled appointments with your provider. ~ Medication Advice: - Discontinue all nonprescription and alternative medications, especially stimulants, until evaluated by provider. - Take prescribed medications as directed, following label instructions for the medication. - Do not change medications or dosing regimen until provider is consulted. - Know possible side effects of medication and what to do if they occur. - Tell provider all prescription, nonprescription or alternative medications that you take ~ 01/17/2012 7:36:09PM Page 1 of 1 [email or web address]

## 2012-01-30 ENCOUNTER — Telehealth: Payer: Self-pay | Admitting: *Deleted

## 2012-01-30 ENCOUNTER — Telehealth: Payer: Self-pay | Admitting: Obstetrics and Gynecology

## 2012-01-30 MED ORDER — FLUCONAZOLE 150 MG PO TABS: ORAL_TABLET | ORAL | Status: DC

## 2012-01-30 NOTE — Telephone Encounter (Signed)
Pt seen in Saturday clinic for Sinusitis, prescribed ABX; requesting Diflucan for yeast infection. Please advise.

## 2012-01-30 NOTE — Telephone Encounter (Signed)
Done per emr 

## 2012-01-30 NOTE — Telephone Encounter (Signed)
Patient informed. 

## 2012-01-30 NOTE — Telephone Encounter (Signed)
Routed to triage 

## 2012-01-31 ENCOUNTER — Telehealth: Payer: Self-pay | Admitting: Obstetrics and Gynecology

## 2012-01-31 NOTE — Telephone Encounter (Signed)
Left msg for pt to call back rgd msg. bt cma

## 2012-02-07 ENCOUNTER — Telehealth: Payer: Self-pay | Admitting: Obstetrics and Gynecology

## 2012-02-07 NOTE — Telephone Encounter (Signed)
Spoke with pt. rgd msg pt stated that she had already got a new rx from her doctors office. bt cma

## 2012-02-24 ENCOUNTER — Telehealth: Payer: Self-pay | Admitting: Internal Medicine

## 2012-02-24 ENCOUNTER — Other Ambulatory Visit (INDEPENDENT_AMBULATORY_CARE_PROVIDER_SITE_OTHER): Payer: 59

## 2012-02-24 DIAGNOSIS — Z0000 Encounter for general adult medical examination without abnormal findings: Secondary | ICD-10-CM

## 2012-02-24 DIAGNOSIS — I10 Essential (primary) hypertension: Secondary | ICD-10-CM

## 2012-02-24 DIAGNOSIS — J309 Allergic rhinitis, unspecified: Secondary | ICD-10-CM

## 2012-02-24 DIAGNOSIS — E876 Hypokalemia: Secondary | ICD-10-CM

## 2012-02-24 DIAGNOSIS — D649 Anemia, unspecified: Secondary | ICD-10-CM

## 2012-02-24 LAB — BASIC METABOLIC PANEL
BUN: 11 mg/dL (ref 6–23)
Calcium: 9 mg/dL (ref 8.4–10.5)
GFR: 123.63 mL/min (ref >60.00)
Glucose, Bld: 78 mg/dL (ref 70–99)
Sodium: 133 mEq/L — ABNORMAL LOW (ref 135–145)

## 2012-02-24 LAB — IBC PANEL
Iron: 51 ug/dL (ref 42–145)
Transferrin: 255.4 mg/dL (ref 212.0–360.0)

## 2012-02-24 LAB — CBC WITH DIFFERENTIAL/PLATELET
Basophils Absolute: 0 10*3/uL (ref 0.0–0.1)
Eosinophils Absolute: 0.1 10*3/uL (ref 0.0–0.7)
Lymphocytes Relative: 40.9 % (ref 12.0–46.0)
MCHC: 31.4 g/dL (ref 30.0–36.0)
Neutrophils Relative %: 50.2 % (ref 43.0–77.0)
RDW: 20.9 % — ABNORMAL HIGH (ref 11.5–14.6)

## 2012-02-24 LAB — LIPID PANEL
HDL: 84.3 mg/dL (ref >39.00)
Total CHOL/HDL Ratio: 2
VLDL: 10.8 mg/dL (ref 0.0–40.0)

## 2012-02-24 NOTE — Telephone Encounter (Signed)
Stacey, please, inform patient that all labs are normal except for a slight anemia Thx  

## 2012-02-25 ENCOUNTER — Telehealth: Payer: Self-pay

## 2012-02-25 NOTE — Telephone Encounter (Signed)
Pt's husband informed.

## 2012-02-25 NOTE — Telephone Encounter (Signed)
Patient called [facility] requesting lab results personally, didn't get details from husband. Thanks

## 2012-02-26 NOTE — Telephone Encounter (Signed)
I called pt- she states she doesn't want any one knowing her information. I advised her that she can update her HIPAA/DPR in our system. I informed her in detail about her lab results.

## 2012-04-30 ENCOUNTER — Other Ambulatory Visit: Payer: Self-pay | Admitting: Internal Medicine

## 2012-05-05 ENCOUNTER — Other Ambulatory Visit: Payer: Self-pay | Admitting: *Deleted

## 2012-05-05 MED ORDER — FLUCONAZOLE 150 MG PO TABS: ORAL_TABLET | ORAL | Status: AC

## 2012-06-05 ENCOUNTER — Ambulatory Visit: Payer: 59 | Admitting: Internal Medicine

## 2012-07-07 ENCOUNTER — Telehealth: Payer: Self-pay | Admitting: Obstetrics and Gynecology

## 2012-07-07 NOTE — Telephone Encounter (Signed)
Tc to pt per telephone call. Pt c/o spotting x 1 day on 06/26/12. Pt with endometrial ablation and hysteroscopy 11/08/11. Pt c/o nausea since last week with improvement with Phenergan(given s/p surgery). No fever. Will consult with AVS per recs. Pt opts not to have office eval unless absolutely necessary by provider recs.

## 2012-07-08 ENCOUNTER — Telehealth: Payer: Self-pay

## 2012-07-08 NOTE — Telephone Encounter (Signed)
Tc to pt per consult with avs rgdg episode of spotting last week x 1 day. Informed pt to monitor spotting and if reoccurs to call office. Pt voices understanding.

## 2012-08-24 ENCOUNTER — Telehealth: Payer: Self-pay | Admitting: Internal Medicine

## 2012-08-24 DIAGNOSIS — Z0000 Encounter for general adult medical examination without abnormal findings: Secondary | ICD-10-CM

## 2012-08-24 NOTE — Telephone Encounter (Signed)
Patient rescheduled her 6 month follow up and she would like to know if she is supposed to have labs prior

## 2012-08-25 NOTE — Telephone Encounter (Signed)
Done. Pt informed.

## 2012-08-25 NOTE — Telephone Encounter (Signed)
Labs for wellness Thx

## 2012-08-31 ENCOUNTER — Other Ambulatory Visit (INDEPENDENT_AMBULATORY_CARE_PROVIDER_SITE_OTHER): Payer: 59

## 2012-08-31 DIAGNOSIS — Z0000 Encounter for general adult medical examination without abnormal findings: Secondary | ICD-10-CM

## 2012-08-31 LAB — URINALYSIS, ROUTINE W REFLEX MICROSCOPIC
Bilirubin Urine: NEGATIVE
Hgb urine dipstick: NEGATIVE
Total Protein, Urine: NEGATIVE
Urine Glucose: NEGATIVE

## 2012-08-31 LAB — HEPATIC FUNCTION PANEL
ALT: 14 U/L (ref 0–35)
AST: 15 U/L (ref 0–37)
Alkaline Phosphatase: 47 U/L (ref 39–117)
Bilirubin, Direct: 0.1 mg/dL (ref 0.0–0.3)
Total Bilirubin: 0.3 mg/dL (ref 0.3–1.2)

## 2012-08-31 LAB — BASIC METABOLIC PANEL
Chloride: 106 mEq/L (ref 96–112)
Potassium: 3.4 mEq/L — ABNORMAL LOW (ref 3.5–5.1)

## 2012-08-31 LAB — CBC WITH DIFFERENTIAL/PLATELET
Eosinophils Relative: 0.8 % (ref 0.0–5.0)
HCT: 37.1 % (ref 36.0–46.0)
Hemoglobin: 12.1 g/dL (ref 12.0–15.0)
Lymphs Abs: 1.9 10*3/uL (ref 0.7–4.0)
MCV: 87.4 fl (ref 78.0–100.0)
Monocytes Relative: 6.1 % (ref 3.0–12.0)
Neutro Abs: 3.9 10*3/uL (ref 1.4–7.7)
RDW: 13.9 % (ref 11.5–14.6)
WBC: 6.3 10*3/uL (ref 4.5–10.5)

## 2012-08-31 LAB — TSH: TSH: 0.45 u[IU]/mL (ref 0.35–5.50)

## 2012-08-31 LAB — LIPID PANEL
Total CHOL/HDL Ratio: 3
VLDL: 13 mg/dL (ref 0.0–40.0)

## 2012-09-04 ENCOUNTER — Encounter: Payer: Self-pay | Admitting: Internal Medicine

## 2012-09-04 ENCOUNTER — Ambulatory Visit (INDEPENDENT_AMBULATORY_CARE_PROVIDER_SITE_OTHER): Payer: 59 | Admitting: Internal Medicine

## 2012-09-04 VITALS — BP 128/84 | HR 80 | Temp 97.9°F | Resp 16 | Wt 145.0 lb

## 2012-09-04 DIAGNOSIS — I10 Essential (primary) hypertension: Secondary | ICD-10-CM

## 2012-09-04 DIAGNOSIS — D649 Anemia, unspecified: Secondary | ICD-10-CM

## 2012-09-04 DIAGNOSIS — J019 Acute sinusitis, unspecified: Secondary | ICD-10-CM

## 2012-09-04 DIAGNOSIS — E876 Hypokalemia: Secondary | ICD-10-CM

## 2012-09-04 MED ORDER — AMITRIPTYLINE HCL 25 MG PO TABS: 25.0000 mg | ORAL_TABLET | Freq: Every day | ORAL | Status: DC

## 2012-09-04 MED ORDER — AMOXICILLIN 500 MG PO CAPS: 1000.0000 mg | ORAL_CAPSULE | Freq: Two times a day (BID) | ORAL | Status: DC

## 2012-09-04 MED ORDER — ZOLPIDEM TARTRATE 10 MG PO TABS: 10.0000 mg | ORAL_TABLET | Freq: Every evening | ORAL | Status: DC | PRN

## 2012-09-04 NOTE — Assessment & Plan Note (Signed)
Much better 

## 2012-09-04 NOTE — Assessment & Plan Note (Addendum)
Watching  OTC KCl

## 2012-09-04 NOTE — Progress Notes (Signed)
   Subjective:    Patient ID: [name], female    DOB: 1971/08/29, 41 y.o.   MRN: [id number]  HPI  The patient presents for a follow-up of  chronic hypertension, chronic dyslipidemia, anemia controlled with medicines C/o stress, poor sleep     Review of Systems  Constitutional: Negative.  Negative for fever, chills, diaphoresis, activity change, appetite change, fatigue and unexpected weight change.  HENT: Negative for hearing loss, ear pain, nosebleeds, congestion, sore throat, facial swelling, rhinorrhea, sneezing, mouth sores, trouble swallowing, neck pain, neck stiffness, postnasal drip, sinus pressure and tinnitus.   Eyes: Negative for pain, discharge, redness, itching and visual disturbance.  Respiratory: Negative for cough, chest tightness, shortness of breath, wheezing and stridor.   Cardiovascular: Negative for chest pain, palpitations and leg swelling.  Gastrointestinal: Negative for nausea, diarrhea, constipation, blood in stool, abdominal distention, anal bleeding and rectal pain.  Genitourinary: Negative for dysuria, urgency, frequency, hematuria, flank pain, vaginal bleeding, vaginal discharge, difficulty urinating, genital sores and pelvic pain.  Musculoskeletal: Negative for back pain, joint swelling, arthralgias and gait problem.  Skin: Negative.  Negative for rash.  Neurological: Negative for dizziness, tremors, seizures, syncope, speech difficulty, weakness, numbness and headaches.  Hematological: Negative for adenopathy. Does not bruise/bleed easily.  Psychiatric/Behavioral: Positive for sleep disturbance. Negative for suicidal ideas, behavioral problems, dysphoric mood and decreased concentration. The patient is nervous/anxious.    For ROS see HPI     Objective:   Physical Exam  Vitals reviewed. Constitutional: She is oriented to person, place, and time. She appears well-developed and well-nourished. No distress.  HENT:  Head: Normocephalic and atraumatic.    Right Ear: Tympanic membrane normal.  Left Ear: Tympanic membrane normal.  Nose: Mucosal edema and rhinorrhea present. Right sinus exhibits maxillary sinus tenderness and frontal sinus tenderness. Left sinus exhibits maxillary sinus tenderness and frontal sinus tenderness.  Mouth/Throat: Uvula is midline and mucous membranes are normal. Posterior oropharyngeal erythema present. No oropharyngeal exudate.  Eyes: Conjunctivae normal and EOM are normal. Pupils are equal, round, and reactive to light.  Neck: Normal range of motion. Neck supple.  Cardiovascular: Normal rate, regular rhythm and normal heart sounds.   Pulmonary/Chest: Effort normal and breath sounds normal. No respiratory distress. She has no wheezes.  Musculoskeletal: She exhibits no edema.  Lymphadenopathy:    She has no cervical adenopathy.  Neurological: She is alert and oriented to person, place, and time.          Assessment & Plan:

## 2012-09-04 NOTE — Assessment & Plan Note (Signed)
Continue with current prescription therapy as reflected on the Med list.  

## 2012-09-04 NOTE — Assessment & Plan Note (Signed)
Amoxicillin x10d 

## 2012-09-05 ENCOUNTER — Encounter: Payer: Self-pay | Admitting: Internal Medicine

## 2012-11-24 ENCOUNTER — Telehealth: Payer: Self-pay | Admitting: Internal Medicine

## 2012-11-24 MED ORDER — SPIRONOLACTONE 25 MG PO TABS: 25.0000 mg | ORAL_TABLET | Freq: Every day | ORAL | Status: DC

## 2012-11-24 NOTE — Telephone Encounter (Signed)
Done. Pt informed.

## 2012-11-24 NOTE — Telephone Encounter (Signed)
Caller: Meranda Facility: home Patient: [name], [name] DOB: 01-05-1971 Phone: [phone] Reason for Call: She is on Spironalactone 25mg  po qd. She does this by mail order but forgot to get a new script (her dad has recently been dx with cancer and she has been busy with him). She has enough for 1 week. Needs a 2 week supply called to [facility] on [address] at [phone] and then a script to mail order sent [facility] at [phone].

## 2012-12-08 ENCOUNTER — Other Ambulatory Visit: Payer: Self-pay | Admitting: Obstetrics and Gynecology

## 2012-12-16 ENCOUNTER — Ambulatory Visit (INDEPENDENT_AMBULATORY_CARE_PROVIDER_SITE_OTHER): Payer: 59 | Admitting: Internal Medicine

## 2012-12-16 ENCOUNTER — Encounter: Payer: Self-pay | Admitting: Internal Medicine

## 2012-12-16 ENCOUNTER — Other Ambulatory Visit (INDEPENDENT_AMBULATORY_CARE_PROVIDER_SITE_OTHER): Payer: 59

## 2012-12-16 VITALS — BP 142/98 | HR 84 | Temp 98.1°F | Resp 16 | Wt 145.0 lb

## 2012-12-16 DIAGNOSIS — E876 Hypokalemia: Secondary | ICD-10-CM

## 2012-12-16 DIAGNOSIS — K219 Gastro-esophageal reflux disease without esophagitis: Secondary | ICD-10-CM

## 2012-12-16 DIAGNOSIS — I10 Essential (primary) hypertension: Secondary | ICD-10-CM

## 2012-12-16 DIAGNOSIS — G43909 Migraine, unspecified, not intractable, without status migrainosus: Secondary | ICD-10-CM

## 2012-12-16 DIAGNOSIS — J309 Allergic rhinitis, unspecified: Secondary | ICD-10-CM

## 2012-12-16 LAB — URINALYSIS
Ketones, ur: NEGATIVE
Leukocytes, UA: NEGATIVE
Specific Gravity, Urine: 1.025 (ref 1.000–1.030)
Urine Glucose: NEGATIVE
pH: 7 (ref 5.0–8.0)

## 2012-12-16 MED ORDER — DILTIAZEM HCL ER COATED BEADS 180 MG PO CP24: 180.0000 mg | ORAL_CAPSULE | Freq: Every day | ORAL | Status: DC

## 2012-12-16 MED ORDER — AMLODIPINE BESYLATE 5 MG PO TABS: 5.0000 mg | ORAL_TABLET | Freq: Every day | ORAL | Status: DC

## 2012-12-16 MED ORDER — BUTALBITAL-ACETAMINOPHEN 50-300 MG PO TABS: 1.0000 | ORAL_TABLET | Freq: Two times a day (BID) | ORAL | Status: DC | PRN

## 2012-12-16 NOTE — Assessment & Plan Note (Signed)
Start

## 2012-12-16 NOTE — Progress Notes (Signed)
   Subjective:     Headache  Pertinent negatives include no abdominal pain, back pain, coughing, dizziness, ear pain, eye pain, fever, hearing loss, nausea, neck pain, sinus pressure, sore throat or vomiting. Her past medical history is significant for hypertension.  Hypertension Associated symptoms include headaches. Pertinent negatives include no chest pain, neck pain or palpitations.   BP Readings from Last 3 Encounters:  12/16/12 142/98  09/04/12 128/84  01/18/12 138/92   Wt Readings from Last 3 Encounters:  12/16/12 145 lb (65.772 kg)  09/04/12 145 lb (65.772 kg)  01/18/12 141 lb (63.957 kg)      Review of Systems  Constitutional: Negative for fever, chills, diaphoresis, activity change, appetite change, fatigue and unexpected weight change.  HENT: Negative for hearing loss, ear pain, congestion, sore throat, sneezing, mouth sores, neck pain, dental problem, voice change, postnasal drip and sinus pressure.   Eyes: Negative for pain and visual disturbance.  Respiratory: Negative for cough, chest tightness, wheezing and stridor.   Cardiovascular: Negative for chest pain, palpitations and leg swelling.  Gastrointestinal: Negative for nausea, vomiting, abdominal pain, blood in stool, abdominal distention and rectal pain.  Genitourinary: Negative for dysuria, hematuria, decreased urine volume, vaginal bleeding, vaginal discharge, difficulty urinating, vaginal pain and menstrual problem.  Musculoskeletal: Negative for back pain, joint swelling and gait problem.  Skin: Negative for color change, rash and wound.  Neurological: Positive for headaches. Negative for dizziness, tremors, syncope, speech difficulty and light-headedness.  Hematological: Negative for adenopathy.  Psychiatric/Behavioral: Negative for suicidal ideas, hallucinations, behavioral problems, confusion, sleep disturbance, dysphoric mood and decreased concentration. The patient is not hyperactive.        Objective:    Physical Exam  Constitutional: She appears well-developed. No distress.  HENT:  Head: Normocephalic.  Right Ear: External ear normal.  Left Ear: External ear normal.  Nose: Nose normal.  Mouth/Throat: Oropharynx is clear and moist.  Eyes: Conjunctivae are normal. Pupils are equal, round, and reactive to light. Right eye exhibits no discharge. Left eye exhibits no discharge.  Neck: Normal range of motion. Neck supple. No JVD present. No tracheal deviation present. No thyromegaly present.  Cardiovascular: Normal rate, regular rhythm and normal heart sounds.   Pulmonary/Chest: No stridor. No respiratory distress. She has no wheezes.  Abdominal: Soft. Bowel sounds are normal. She exhibits no distension and no mass. There is no tenderness. There is no rebound and no guarding.  Musculoskeletal: She exhibits no edema and no tenderness.  Lymphadenopathy:    She has no cervical adenopathy.  Neurological: She displays normal reflexes. No cranial nerve deficit. She exhibits normal muscle tone. Coordination normal.  Skin: No rash noted. No erythema.  Psychiatric: She has a normal mood and affect. Her behavior is normal. Judgment and thought content normal.    Lab Results  Component Value Date   WBC 6.3 08/31/2012   HGB 12.1 08/31/2012   HCT 37.1 08/31/2012   PLT 313.0 08/31/2012   GLUCOSE 105* 08/31/2012   CHOL 250* 08/31/2012   TRIG 65.0 08/31/2012   HDL 81.10 08/31/2012   LDLDIRECT 143.1 08/31/2012   LDLCALC 103* 02/24/2012   ALT 14 08/31/2012   AST 15 08/31/2012   NA 139 08/31/2012   K 3.4* 08/31/2012   CL 106 08/31/2012   CREATININE 0.6 08/31/2012   BUN 11 08/31/2012   CO2 27 08/31/2012   TSH 0.45 08/31/2012         Assessment & Plan:

## 2012-12-17 ENCOUNTER — Encounter: Payer: Self-pay | Admitting: Internal Medicine

## 2012-12-17 LAB — BASIC METABOLIC PANEL
BUN: 11 mg/dL (ref 6–23)
Chloride: 104 mEq/L (ref 96–112)
Glucose, Bld: 94 mg/dL (ref 70–99)
Potassium: 3.8 mEq/L (ref 3.5–5.1)

## 2012-12-17 NOTE — Assessment & Plan Note (Signed)
meds prn

## 2012-12-17 NOTE — Assessment & Plan Note (Signed)
Continue with current prescription therapy as reflected on the Med list.  Will consult Dr Maple Hudson

## 2012-12-17 NOTE — Assessment & Plan Note (Signed)
2/13 ?etiol Mild She was put on Spironolactone a long time ago  Check BMET

## 2012-12-17 NOTE — Assessment & Plan Note (Signed)
Doing well 

## 2012-12-18 ENCOUNTER — Ambulatory Visit: Payer: 59 | Admitting: Internal Medicine

## 2013-01-06 ENCOUNTER — Telehealth: Payer: Self-pay | Admitting: Internal Medicine

## 2013-01-06 NOTE — Telephone Encounter (Signed)
Patient Information:  Caller Name: Tijah  Phone: [phone]  Patient: [name], [name]  Gender: Female  DOB: 04-Aug-1971  Age: 42 Years  PCP: Plotnikov, Alex (Adults only)  Pregnant: No  Office Follow Up:  Does the office need to follow up with this patient?: Yes  Instructions For The Office: Please ask Dr. Posey Rea if medication needs to be changed  RN Note:  Please ask Dr. Cam Hai if he would like her to try another BP med  Symptoms  Reason For Call & Symptoms: Seen in office three weeks ago and Cardiazem increased from from 120 to 180 mgs and BP still elevated. BP= 138-140/90-95 at 1200 01/06/13. She has been having headaches again this week with pressure behind eyes and feeling lightheaded. She is taking new med at night for headaches and is waking up with headache- takes Alleve during the day.  Reviewed Health History In EMR: Yes  Reviewed Medications In EMR: Yes  Reviewed Allergies In EMR: Yes  Reviewed Surgeries / Procedures: Yes  Date of Onset of Symptoms: 12/23/2012  Treatments Tried: Alleve q 12 hour prn  Treatments Tried Worked: No OB / GYN:  LMP: Unknown  Guideline(s) Used:  Headache  Disposition Per Guideline:   See Today or Tomorrow in Office  Reason For Disposition Reached:   Unexplained headache that is present > 24 hours  Advice Given:  Pain Medicines:  Naproxen (e.g., Aleve):  Take 220 mg (one 220 mg pill) by mouth every 8 hours as needed. You may take 440 mg (two 220 mg pills) for your first dose.  The most you should take each day is 660 mg (three 220 mg pills a day), unless your doctor has told you to take more.  Migraine Medication:   If your doctor has prescribed specific medication for your migraine, take it as directed as soon as the migraine starts.  Rest:   Lie down in a dark, quiet place and try to relax. Close your eyes and imagine your entire body relaxing.  Call Back If:  Headache lasts longer than 24 hours  You become worse.  Patient  Refused Recommendation:  Patient Will Make Own Appointment  Wanting to report symptoms to see if another med or increase is needed.

## 2013-01-11 ENCOUNTER — Ambulatory Visit
Admission: RE | Admit: 2013-01-11 | Discharge: 2013-01-11 | Disposition: A | Discharge status: Home / Self Care | Payer: 59 | Source: Ambulatory Visit | Attending: Obstetrics and Gynecology | Admitting: Obstetrics and Gynecology

## 2013-01-11 DIAGNOSIS — Z1231 Encounter for screening mammogram for malignant neoplasm of breast: Secondary | ICD-10-CM

## 2013-01-26 ENCOUNTER — Institutional Professional Consult (permissible substitution): Payer: 59 | Admitting: Internal Medicine

## 2013-02-19 ENCOUNTER — Ambulatory Visit (INDEPENDENT_AMBULATORY_CARE_PROVIDER_SITE_OTHER): Payer: 59 | Admitting: Internal Medicine

## 2013-02-19 ENCOUNTER — Encounter: Payer: Self-pay | Admitting: Internal Medicine

## 2013-02-19 VITALS — BP 110/74 | HR 68 | Ht 67.0 in | Wt 144.0 lb

## 2013-02-19 DIAGNOSIS — J309 Allergic rhinitis, unspecified: Secondary | ICD-10-CM

## 2013-02-19 DIAGNOSIS — J3089 Other allergic rhinitis: Secondary | ICD-10-CM

## 2013-02-19 NOTE — Patient Instructions (Addendum)
Sample Dymista nasal spray   1-2 puffs each nostril once daily    Try this instead of Nasonex for comparison  You can try an otc antihistamine like Claritin/ loratadine, Zyrtec/ cetirizine or Allegra/ fexofenadine  You can also take an otc decongestant  Sudafed-PE if needed  Order- lab Allergy Profile    Dx Allergic rhinitis

## 2013-02-19 NOTE — Progress Notes (Signed)
Subjective:    Patient ID: [name], female    DOB: February 10, 1971, 42 y.o.   MRN: [id number]  HPI 02/19/13- 48 yoFnever smoker referred by Dr. Valentino Hue for allergies. Reports lots of sinus pressure, itchy eyes and slight sneezing. She has had hayfever symptoms and "sinus headache" off-and-on since childhood. Acute episodes about 3 times per year. She was on allergy vaccine as a child. Her worst season is from March into summer. Sh.e has been using over-the-counter antihistamines and Nasonex. Denies history of asthma. Denies special problems with foods, insect stings, urticaria or skin rashes. There is no sensitivity to latex, contrast dye or aspirin. ENT surgery for tonsils. Family history that father had seasonal allergic rhinitis. She is married with children, working as a Educational psychologist at [employer], with considerable exposure to paper dust. Home environment is not remarkable for mold, dust or animals and there are no smokers  Prior to Admission medications   Medication Sig Start Date End Date Taking? Authorizing Provider  acetaminophen (TYLENOL) 500 MG tablet Take 500 mg by mouth daily as needed. For headache   Yes Historical Provider, MD  amitriptyline (ELAVIL) 25 MG tablet Take 1-2 tablets (25-50 mg total) by mouth at bedtime. 09/04/12  Yes Georgina Quint Plotnikov, MD  aspirin 81 MG tablet Take 81 mg by mouth daily.     Yes Historical Provider, MD  Butalbital-Acetaminophen (BUPAP) 50-300 MG TABS Take 1 tablet by mouth 2 (two) times daily as needed. 12/16/12  Yes Georgina Quint Plotnikov, MD  diltiazem (CARTIA XT) 180 MG 24 hr capsule Take 1 capsule (180 mg total) by mouth daily. 12/16/12  Yes Georgina Quint Plotnikov, MD  fish oil-omega-3 fatty acids 1000 MG capsule Take 1 g by mouth daily.   Yes Historical Provider, MD  omeprazole (PRILOSEC) 20 MG capsule Take 20 mg by mouth daily.   Yes Historical Provider, MD  spironolactone (ALDACTONE) 25 MG tablet Take 1 tablet (25 mg total) by  mouth daily. 11/24/12  Yes Georgina Quint Plotnikov, MD  zolpidem (AMBIEN) 10 MG tablet Take 1 tablet (10 mg total) by mouth at bedtime as needed for sleep. 09/04/12 02/19/14 Yes Tresa Garter, MD   Past Medical History  Diagnosis Date  . HTN (hypertension)   . Hx of migraines   . GERD (gastroesophageal reflux disease)   . Allergic rhinitis   . Anemia     NOS  . Missed abortion     x 2  . Termination of pregnancy     x 1  . Headache   . SVT (supraventricular tachycardia)     h/o r/t stress, no problems  . Menorrhagia   . Dysmenorrhea   . Chicken pox   . Chicken pox   . Dysuria    Past Surgical History  Procedure Laterality Date  . Appendectomy  1992  . Tonsillectomy  1978  . Adenoidectomy  1978  . Hernia repair  08/2006    umbilical  . Svd      x 2  . Upper gastrointestinal endoscopy    . 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 Dilation and curettage of uterus    . Hysterocospy     Family History  Problem Relation Age of Onset  . Stroke Mother   . Hypertension Mother   . Diabetes Mother   . Stroke Father   . Hypertension Father    History   Social History  . Marital Status: Married  Spouse Name: N/A    Number of Children: N/A  . Years of Education: N/A   Occupational History  . Not on file.   Social History Main Topics  . Smoking status: Never Smoker   . Smokeless tobacco: Never Used  . Alcohol Use: Yes     Comment: socially  . Drug Use: No  . Sexually Active: Yes     Comment: husband - vasectomy   Other Topics Concern  . Not on file   Social History Narrative  . No narrative on file   Review of Systems  Constitutional: Negative for fever and unexpected weight change.  HENT: Positive for ear pain, congestion, sore throat, sneezing and sinus pressure. Negative for nosebleeds, rhinorrhea, trouble swallowing, dental problem and postnasal drip.   Eyes: Positive for itching. Negative for redness.  Respiratory: Negative for cough, chest  tightness, shortness of breath and wheezing.   Cardiovascular: Negative for palpitations and leg swelling.  Gastrointestinal: Positive for nausea. Negative for vomiting.  Genitourinary: Negative for dysuria.  Musculoskeletal: Negative for joint swelling.  Skin: Negative for rash.  Neurological: Positive for headaches.  Hematological: Does not bruise/bleed easily.  Psychiatric/Behavioral: Negative for dysphoric mood. The patient is not nervous/anxious.        Objective:   Physical Exam OBJ- Physical Exam General- Alert, Oriented, Affect-appropriate, Distress- none acute, well- appearing Skin- rash-none, lesions- none, excoriation- none. Wig. Lymphadenopathy- none Head- atraumatic            Eyes- Gross vision intact, PERRLA, conjunctivae and secretions clear            Ears- Hearing, canals-normal            Nose- +mild turbinate edema, no-Septal dev, mucus, polyps, erosion, perforation             Throat- Mallampati II , mucosa clear , drainage- none, tonsils- atrophic Neck- flexible , trachea midline, no stridor , thyroid nl, carotid no bruit Chest - symmetrical excursion , unlabored           Heart/CV- RRR , no murmur , no gallop  , no rub, nl s1 s2                           - JVD- none , edema- none, stasis changes- none, varices- none           Lung- clear to P&A, wheeze- none, cough- none , dullness-none, rub- none           Chest wall-  Abd- tender-no, distended-no, bowel sounds-present, HSM- no Br/ Gen/ Rectal- Not done, not indicated Extrem- cyanosis- none, clubbing, none, atrophy- none, strength- nl Neuro- grossly intact to observation    Assessment & Plan:

## 2013-03-03 NOTE — Assessment & Plan Note (Signed)
Chronic rhinitis with episodes of sinusitis. History that she was skin test positive and on allergy vaccine as a child. Not getting adequate symptomatic control now with antihistamines and nasal steroids. Plan-educated on environmental precautions including encasings, air cleaners. Ordered lab for allergy profile and give sample Dymista nasal spray for trial

## 2013-03-05 ENCOUNTER — Ambulatory Visit: Payer: 59 | Admitting: Internal Medicine

## 2013-03-05 ENCOUNTER — Other Ambulatory Visit: Payer: 59

## 2013-03-05 DIAGNOSIS — J302 Other seasonal allergic rhinitis: Secondary | ICD-10-CM

## 2013-03-05 DIAGNOSIS — Z0289 Encounter for other administrative examinations: Secondary | ICD-10-CM

## 2013-03-05 DIAGNOSIS — J3089 Other allergic rhinitis: Secondary | ICD-10-CM

## 2013-03-05 NOTE — Addendum Note (Signed)
Addended by: Ronny Bacon on: 03/05/2013 02:29 PM   Modules accepted: Orders

## 2013-03-09 LAB — ALLERGY FULL PROFILE
Allergen,Goose feathers, e70: <0.1 kU/L
Alternaria Alternata: <0.1 kU/L
Aspergillus fumigatus, m3: <0.1 kU/L
Bermuda Grass: <0.1 kU/L
Candida Albicans: <0.1 kU/L
Cat Dander: <0.1 kU/L
Common Ragweed: <0.1 kU/L
D. farinae: <0.1 kU/L
Elm IgE: <0.1 kU/L
G009 Red Top: <0.1 kU/L
House Dust Hollister: <0.1 kU/L
Lamb's Quarters: <0.1 kU/L
Plantain: <0.1 kU/L
Sycamore Tree: <0.1 kU/L

## 2013-03-12 NOTE — Progress Notes (Signed)
Quick Note:  PT AWARE OF RESULTS. ______ 

## 2013-04-07 ENCOUNTER — Telehealth: Payer: Self-pay | Admitting: *Deleted

## 2013-04-07 ENCOUNTER — Other Ambulatory Visit: Payer: Self-pay | Admitting: Internal Medicine

## 2013-04-07 NOTE — Telephone Encounter (Signed)
Spoke with pt advised her as per her chart she should have current refills at pharmacy.  Pt is calling [facility] for verification.

## 2013-05-07 ENCOUNTER — Ambulatory Visit: Payer: 59 | Admitting: Internal Medicine

## 2013-05-07 ENCOUNTER — Telehealth: Payer: Self-pay

## 2013-05-07 NOTE — Telephone Encounter (Signed)
Please advise if ok to refill ambien 10 mg, last filled 09/04/12 and pt last seen 12/16/12. Thanks

## 2013-05-10 NOTE — Telephone Encounter (Signed)
OK to fill this prescription with additional refills x1 Thank you!  

## 2013-05-11 MED ORDER — ZOLPIDEM TARTRATE 10 MG PO TABS: 10.0000 mg | ORAL_TABLET | Freq: Every evening | ORAL | Status: DC | PRN

## 2013-06-03 ENCOUNTER — Telehealth: Payer: Self-pay | Admitting: *Deleted

## 2013-06-03 NOTE — Telephone Encounter (Signed)
Pt called requesting Ambien refill.  pts last OV 3.2014.  Please advise

## 2013-06-03 NOTE — Telephone Encounter (Signed)
OK to fill this prescription with additional refills x2 Thank you!  

## 2013-06-07 MED ORDER — ZOLPIDEM TARTRATE 10 MG PO TABS: 10.0000 mg | ORAL_TABLET | Freq: Every evening | ORAL | Status: DC | PRN

## 2013-06-25 ENCOUNTER — Encounter: Payer: Self-pay | Admitting: Internal Medicine

## 2013-06-25 ENCOUNTER — Other Ambulatory Visit (INDEPENDENT_AMBULATORY_CARE_PROVIDER_SITE_OTHER): Payer: 59

## 2013-06-25 ENCOUNTER — Ambulatory Visit (INDEPENDENT_AMBULATORY_CARE_PROVIDER_SITE_OTHER): Payer: 59 | Admitting: Internal Medicine

## 2013-06-25 ENCOUNTER — Other Ambulatory Visit: Payer: Self-pay | Admitting: *Deleted

## 2013-06-25 VITALS — BP 138/96 | HR 72 | Temp 98.0°F | Resp 14 | Wt 144.0 lb

## 2013-06-25 DIAGNOSIS — E876 Hypokalemia: Secondary | ICD-10-CM

## 2013-06-25 DIAGNOSIS — G4700 Insomnia, unspecified: Secondary | ICD-10-CM

## 2013-06-25 DIAGNOSIS — J302 Other seasonal allergic rhinitis: Secondary | ICD-10-CM

## 2013-06-25 DIAGNOSIS — J309 Allergic rhinitis, unspecified: Secondary | ICD-10-CM

## 2013-06-25 DIAGNOSIS — I10 Essential (primary) hypertension: Secondary | ICD-10-CM

## 2013-06-25 LAB — BASIC METABOLIC PANEL
CO2: 29 mEq/L (ref 19–32)
Chloride: 103 mEq/L (ref 96–112)
Creatinine, Ser: 0.6 mg/dL (ref 0.4–1.2)
Glucose, Bld: 88 mg/dL (ref 70–99)

## 2013-06-25 MED ORDER — ZOLPIDEM TARTRATE 10 MG PO TABS: 10.0000 mg | ORAL_TABLET | Freq: Every evening | ORAL | Status: DC | PRN

## 2013-06-25 NOTE — Progress Notes (Signed)
   Subjective:     Headache  Pertinent negatives include no abdominal pain, back pain, coughing, dizziness, ear pain, eye pain, fever, hearing loss, nausea, neck pain, sinus pressure, sore throat or vomiting. Her past medical history is significant for hypertension.  Hypertension Associated symptoms include headaches. Pertinent negatives include no chest pain, neck pain or palpitations.   BP Readings from Last 3 Encounters:  06/25/13 138/96  02/19/13 110/74  12/16/12 142/98   Wt Readings from Last 3 Encounters:  06/25/13 144 lb (65.318 kg)  02/19/13 144 lb (65.318 kg)  12/16/12 145 lb (65.772 kg)      Review of Systems  Constitutional: Negative for fever, chills, diaphoresis, activity change, appetite change, fatigue and unexpected weight change.  HENT: Negative for hearing loss, ear pain, congestion, sore throat, sneezing, mouth sores, neck pain, dental problem, voice change, postnasal drip and sinus pressure.   Eyes: Negative for pain and visual disturbance.  Respiratory: Negative for cough, chest tightness, wheezing and stridor.   Cardiovascular: Negative for chest pain, palpitations and leg swelling.  Gastrointestinal: Negative for nausea, vomiting, abdominal pain, blood in stool, abdominal distention and rectal pain.  Genitourinary: Negative for dysuria, hematuria, decreased urine volume, vaginal bleeding, vaginal discharge, difficulty urinating, vaginal pain and menstrual problem.  Musculoskeletal: Negative for back pain, joint swelling and gait problem.  Skin: Negative for color change, rash and wound.  Neurological: Positive for headaches. Negative for dizziness, tremors, syncope, speech difficulty and light-headedness.  Hematological: Negative for adenopathy.  Psychiatric/Behavioral: Negative for suicidal ideas, hallucinations, behavioral problems, confusion, sleep disturbance, dysphoric mood and decreased concentration. The patient is not hyperactive.        Objective:    Physical Exam  Constitutional: She appears well-developed. No distress.  HENT:  Head: Normocephalic.  Right Ear: External ear normal.  Left Ear: External ear normal.  Nose: Nose normal.  Mouth/Throat: Oropharynx is clear and moist.  Eyes: Conjunctivae are normal. Pupils are equal, round, and reactive to light. Right eye exhibits no discharge. Left eye exhibits no discharge.  Neck: Normal range of motion. Neck supple. No JVD present. No tracheal deviation present. No thyromegaly present.  Cardiovascular: Normal rate, regular rhythm and normal heart sounds.   Pulmonary/Chest: No stridor. No respiratory distress. She has no wheezes.  Abdominal: Soft. Bowel sounds are normal. She exhibits no distension and no mass. There is no tenderness. There is no rebound and no guarding.  Musculoskeletal: She exhibits no edema and no tenderness.  Lymphadenopathy:    She has no cervical adenopathy.  Neurological: She displays normal reflexes. No cranial nerve deficit. She exhibits normal muscle tone. Coordination normal.  Skin: No rash noted. No erythema.  Psychiatric: She has a normal mood and affect. Her behavior is normal. Judgment and thought content normal.    Lab Results  Component Value Date   WBC 6.3 08/31/2012   HGB 12.1 08/31/2012   HCT 37.1 08/31/2012   PLT 313.0 08/31/2012   GLUCOSE 94 12/16/2012   CHOL 250* 08/31/2012   TRIG 65.0 08/31/2012   HDL 81.10 08/31/2012   LDLDIRECT 143.1 08/31/2012   LDLCALC 103* 02/24/2012   ALT 14 08/31/2012   AST 15 08/31/2012   NA 137 12/16/2012   K 3.8 12/16/2012   CL 104 12/16/2012   CREATININE 0.8 12/16/2012   BUN 11 12/16/2012   CO2 27 12/16/2012   TSH 1.26 12/16/2012         Assessment & Plan:

## 2013-06-25 NOTE — Assessment & Plan Note (Signed)
Continue with current prescription therapy as reflected on the Med list.  

## 2013-06-25 NOTE — Assessment & Plan Note (Signed)
BP Readings from Last 3 Encounters:  06/25/13 138/96  02/19/13 110/74  12/16/12 142/98   Continue with current prescription therapy as reflected on the Med list.

## 2013-08-04 ENCOUNTER — Ambulatory Visit (INDEPENDENT_AMBULATORY_CARE_PROVIDER_SITE_OTHER): Payer: 59 | Admitting: Internal Medicine

## 2013-08-04 ENCOUNTER — Ambulatory Visit (INDEPENDENT_AMBULATORY_CARE_PROVIDER_SITE_OTHER): Payer: 59

## 2013-08-04 ENCOUNTER — Encounter: Payer: Self-pay | Admitting: Internal Medicine

## 2013-08-04 VITALS — BP 128/94 | HR 80 | Temp 98.5°F | Resp 16 | Wt 144.0 lb

## 2013-08-04 DIAGNOSIS — K219 Gastro-esophageal reflux disease without esophagitis: Secondary | ICD-10-CM

## 2013-08-04 DIAGNOSIS — R1013 Epigastric pain: Secondary | ICD-10-CM | POA: Insufficient documentation

## 2013-08-04 DIAGNOSIS — Z79899 Other long term (current) drug therapy: Secondary | ICD-10-CM

## 2013-08-04 DIAGNOSIS — R109 Unspecified abdominal pain: Secondary | ICD-10-CM

## 2013-08-04 LAB — URINALYSIS
Hgb urine dipstick: NEGATIVE
Leukocytes, UA: NEGATIVE
Nitrite: NEGATIVE
Total Protein, Urine: NEGATIVE
pH: 7 (ref 5.0–8.0)

## 2013-08-04 LAB — CBC WITH DIFFERENTIAL/PLATELET
Basophils Absolute: 0 10*3/uL (ref 0.0–0.1)
Eosinophils Absolute: 0.1 10*3/uL (ref 0.0–0.7)
Lymphocytes Relative: 34.8 % (ref 12.0–46.0)
MCHC: 33.4 g/dL (ref 30.0–36.0)
Monocytes Absolute: 0.5 10*3/uL (ref 0.1–1.0)
Neutrophils Relative %: 58.8 % (ref 43.0–77.0)
Platelets: 311 10*3/uL (ref 150.0–400.0)
RBC: 4.42 Mil/uL (ref 3.87–5.11)
RDW: 14.2 % (ref 11.5–14.6)

## 2013-08-04 LAB — H. PYLORI ANTIBODY, IGG: H Pylori IgG: NEGATIVE

## 2013-08-04 NOTE — Assessment & Plan Note (Signed)
Labs US 

## 2013-08-04 NOTE — Patient Instructions (Signed)
Creon 1 with meals TUMs, Mylanta Dexilant  1 a day

## 2013-08-04 NOTE — Progress Notes (Signed)
Subjective:     Headache  Associated symptoms include abdominal pain and back pain. Pertinent negatives include no coughing, dizziness, ear pain, eye pain, fever, hearing loss, nausea, neck pain, sinus pressure, sore throat or vomiting. Her past medical history is significant for hypertension.  Hypertension Associated symptoms include headaches. Pertinent negatives include no chest pain, neck pain or palpitations.  Abdominal Pain This is a new problem. The onset quality is gradual. The problem occurs constantly. Associated symptoms include diarrhea and headaches. Pertinent negatives include no constipation, dysuria, fever, hematuria, nausea or vomiting.   BP Readings from Last 3 Encounters:  08/04/13 128/94  06/25/13 138/96  02/19/13 110/74   Wt Readings from Last 3 Encounters:  08/04/13 144 lb (65.318 kg)  06/25/13 144 lb (65.318 kg)  02/19/13 144 lb (65.318 kg)      Review of Systems  Constitutional: Negative for fever, chills, diaphoresis, activity change, appetite change, fatigue and unexpected weight change.  HENT: Negative for congestion, dental problem, ear pain, hearing loss, mouth sores, postnasal drip, sinus pressure, sneezing, sore throat and voice change.   Eyes: Negative for pain and visual disturbance.  Respiratory: Negative for cough, chest tightness, wheezing and stridor.   Cardiovascular: Negative for chest pain, palpitations and leg swelling.  Gastrointestinal: Positive for abdominal pain and diarrhea. Negative for nausea, vomiting, constipation, blood in stool, abdominal distention and rectal pain.  Genitourinary: Negative for dysuria, hematuria, decreased urine volume, vaginal bleeding, vaginal discharge, difficulty urinating, vaginal pain and menstrual problem.  Musculoskeletal: Positive for back pain. Negative for gait problem, joint swelling and neck pain.  Skin: Negative for color change, rash and wound.  Neurological: Positive for headaches. Negative for  dizziness, tremors, syncope, speech difficulty and light-headedness.  Hematological: Negative for adenopathy.  Psychiatric/Behavioral: Negative for suicidal ideas, hallucinations, behavioral problems, confusion, sleep disturbance, dysphoric mood and decreased concentration. The patient is not hyperactive.        Objective:   Physical Exam  Constitutional: She appears well-developed. No distress.  HENT:  Head: Normocephalic.  Right Ear: External ear normal.  Left Ear: External ear normal.  Nose: Nose normal.  Mouth/Throat: Oropharynx is clear and moist.  Eyes: Conjunctivae are normal. Pupils are equal, round, and reactive to light. Right eye exhibits no discharge. Left eye exhibits no discharge.  Neck: Normal range of motion. Neck supple. No JVD present. No tracheal deviation present. No thyromegaly present.  Cardiovascular: Normal rate, regular rhythm and normal heart sounds.   Pulmonary/Chest: No stridor. No respiratory distress. She has no wheezes.  Abdominal: Soft. Bowel sounds are normal. She exhibits no distension and no mass. There is tenderness. There is no rebound and no guarding.  Musculoskeletal: She exhibits no edema and no tenderness.  Lymphadenopathy:    She has no cervical adenopathy.  Neurological: She displays normal reflexes. No cranial nerve deficit. She exhibits normal muscle tone. Coordination normal.  Skin: No rash noted. No erythema.  Psychiatric: She has a normal mood and affect. Her behavior is normal. Judgment and thought content normal.    Lab Results  Component Value Date   WBC 6.3 08/31/2012   HGB 12.1 08/31/2012   HCT 37.1 08/31/2012   PLT 313.0 08/31/2012   GLUCOSE 88 06/25/2013   CHOL 250* 08/31/2012   TRIG 65.0 08/31/2012   HDL 81.10 08/31/2012   LDLDIRECT 143.1 08/31/2012   LDLCALC 103* 02/24/2012   ALT 14 08/31/2012   AST 15 08/31/2012   NA 137 06/25/2013   K 4.6 06/25/2013   CL  103 06/25/2013   CREATININE 0.6 06/25/2013   BUN 7 06/25/2013    CO2 29 06/25/2013   TSH 1.26 12/16/2012         Assessment & Plan:

## 2013-08-04 NOTE — Assessment & Plan Note (Signed)
Worse 

## 2013-08-05 LAB — LIPID PANEL
Cholesterol: 208 mg/dL — ABNORMAL HIGH (ref 0–200)
HDL: 75.3 mg/dL (ref >39.00)
Total CHOL/HDL Ratio: 3
Triglycerides: 76 mg/dL (ref 0.0–149.0)
VLDL: 15.2 mg/dL (ref 0.0–40.0)

## 2013-08-05 LAB — BASIC METABOLIC PANEL
CO2: 29 mEq/L (ref 19–32)
Calcium: 9.2 mg/dL (ref 8.4–10.5)
Creatinine, Ser: 0.6 mg/dL (ref 0.4–1.2)
GFR: 145.04 mL/min (ref >60.00)
Potassium: 3.8 mEq/L (ref 3.5–5.1)
Sodium: 138 mEq/L (ref 135–145)

## 2013-08-05 LAB — HEPATIC FUNCTION PANEL
AST: 17 U/L (ref 0–37)
Alkaline Phosphatase: 54 U/L (ref 39–117)
Total Bilirubin: 0.6 mg/dL (ref 0.3–1.2)

## 2013-08-05 LAB — SEDIMENTATION RATE: Sed Rate: 13 mm/hr (ref 0–22)

## 2013-08-06 ENCOUNTER — Other Ambulatory Visit: Payer: 59

## 2013-08-10 ENCOUNTER — Inpatient Hospital Stay: Admission: RE | Admit: 2013-08-10 | Payer: 59 | Source: Ambulatory Visit

## 2013-08-12 ENCOUNTER — Encounter: Payer: Self-pay | Admitting: Internal Medicine

## 2013-08-13 ENCOUNTER — Other Ambulatory Visit: Payer: 59

## 2013-08-19 ENCOUNTER — Other Ambulatory Visit: Payer: Self-pay

## 2013-09-15 ENCOUNTER — Encounter: Payer: Self-pay | Admitting: Family Medicine

## 2013-10-15 ENCOUNTER — Telehealth: Payer: Self-pay | Admitting: Internal Medicine

## 2013-10-15 MED ORDER — SPIRONOLACTONE 25 MG PO TABS: 25.0000 mg | ORAL_TABLET | Freq: Every day | ORAL | Status: DC

## 2013-10-15 NOTE — Telephone Encounter (Signed)
Spironolactone Rf sent. Ok to Rf Ambien?

## 2013-10-15 NOTE — Telephone Encounter (Signed)
Pt request refill for Spironolactron and Ambien to be [facility] into [facility] on [address]. Please advise

## 2013-10-17 NOTE — Telephone Encounter (Signed)
OK to fill this Ambien prescription with additional refills x2 Thank you!

## 2013-10-18 MED ORDER — ZOLPIDEM TARTRATE 10 MG PO TABS: 10.0000 mg | ORAL_TABLET | Freq: Every evening | ORAL | Status: DC | PRN

## 2013-10-18 NOTE — Telephone Encounter (Signed)
Done

## 2013-11-21 ENCOUNTER — Other Ambulatory Visit: Payer: Self-pay | Admitting: Internal Medicine

## 2013-11-22 ENCOUNTER — Telehealth: Payer: Self-pay | Admitting: *Deleted

## 2013-11-22 MED ORDER — ZOLPIDEM TARTRATE 10 MG PO TABS: 10.0000 mg | ORAL_TABLET | Freq: Every evening | ORAL | Status: DC | PRN

## 2013-11-22 NOTE — Telephone Encounter (Signed)
Cathrine MusterYoronda, RPh received telephone refill request order. Notified patient of refill request status and MD's instructions.  Understanding and appreciation verbalized.

## 2013-11-22 NOTE — Telephone Encounter (Signed)
OK to fill this prescription with additional refills x2 Sch ov Thank you!

## 2013-11-22 NOTE — Telephone Encounter (Signed)
Patient phoned (in addition to pharmacy submitting request) requesting ambien refill (last filled 10/18/13) and last OV with PCP 08/04/13.  Please advise   CB# [phone]

## 2013-12-04 ENCOUNTER — Other Ambulatory Visit: Payer: Self-pay | Admitting: Internal Medicine

## 2013-12-14 ENCOUNTER — Other Ambulatory Visit: Payer: Self-pay

## 2013-12-14 DIAGNOSIS — Z1231 Encounter for screening mammogram for malignant neoplasm of breast: Secondary | ICD-10-CM

## 2013-12-31 ENCOUNTER — Encounter: Payer: Self-pay | Admitting: Internal Medicine

## 2013-12-31 ENCOUNTER — Ambulatory Visit (INDEPENDENT_AMBULATORY_CARE_PROVIDER_SITE_OTHER): Payer: BC Managed Care – PPO | Admitting: Internal Medicine

## 2013-12-31 VITALS — BP 128/92 | HR 80 | Temp 98.1°F | Resp 16 | Wt 136.0 lb

## 2013-12-31 DIAGNOSIS — E876 Hypokalemia: Secondary | ICD-10-CM

## 2013-12-31 DIAGNOSIS — K219 Gastro-esophageal reflux disease without esophagitis: Secondary | ICD-10-CM

## 2013-12-31 DIAGNOSIS — Z0000 Encounter for general adult medical examination without abnormal findings: Secondary | ICD-10-CM

## 2013-12-31 DIAGNOSIS — I10 Essential (primary) hypertension: Secondary | ICD-10-CM

## 2013-12-31 NOTE — Assessment & Plan Note (Signed)
Continue with current prescription therapy as reflected on the Med list.  

## 2013-12-31 NOTE — Progress Notes (Signed)
Pre visit review using our clinic review tool, if applicable. No additional management support is needed unless otherwise documented below in the visit note. 

## 2014-01-01 ENCOUNTER — Encounter: Payer: Self-pay | Admitting: Internal Medicine

## 2014-01-01 NOTE — Progress Notes (Signed)
   Subjective:     Headache  This is a recurrent problem. The problem has been gradually improving. Pertinent negatives include no back pain, coughing, dizziness, ear pain, eye pain, hearing loss, neck pain, sinus pressure or sore throat. Her past medical history is significant for hypertension.  Hypertension This is a chronic problem. Pertinent negatives include no chest pain, neck pain or palpitations.   BP Readings from Last 3 Encounters:  12/31/13 128/92  08/04/13 128/94  06/25/13 138/96   Wt Readings from Last 3 Encounters:  12/31/13 136 lb (61.689 kg)  08/04/13 144 lb (65.318 kg)  06/25/13 144 lb (65.318 kg)      Review of Systems  Constitutional: Negative for chills, diaphoresis, activity change, appetite change, fatigue and unexpected weight change.  HENT: Negative for congestion, dental problem, ear pain, hearing loss, mouth sores, postnasal drip, sinus pressure, sneezing, sore throat and voice change.   Eyes: Negative for pain and visual disturbance.  Respiratory: Negative for cough, chest tightness, wheezing and stridor.   Cardiovascular: Negative for chest pain, palpitations and leg swelling.  Gastrointestinal: Negative for blood in stool, abdominal distention and rectal pain.  Genitourinary: Negative for decreased urine volume, vaginal bleeding, vaginal discharge, difficulty urinating, vaginal pain and menstrual problem.  Musculoskeletal: Negative for back pain, gait problem, joint swelling and neck pain.  Skin: Negative for color change, rash and wound.  Neurological: Negative for dizziness, tremors, syncope, speech difficulty and light-headedness.  Hematological: Negative for adenopathy.  Psychiatric/Behavioral: Negative for suicidal ideas, hallucinations, behavioral problems, confusion, sleep disturbance, dysphoric mood and decreased concentration. The patient is not hyperactive.        Objective:   Physical Exam  Constitutional: She appears well-developed. No  distress.  HENT:  Head: Normocephalic.  Right Ear: External ear normal.  Left Ear: External ear normal.  Nose: Nose normal.  Mouth/Throat: Oropharynx is clear and moist.  Eyes: Conjunctivae are normal. Pupils are equal, round, and reactive to light. Right eye exhibits no discharge. Left eye exhibits no discharge.  Neck: Normal range of motion. Neck supple. No JVD present. No tracheal deviation present. No thyromegaly present.  Cardiovascular: Normal rate, regular rhythm and normal heart sounds.   Pulmonary/Chest: No stridor. No respiratory distress. She has no wheezes.  Abdominal: Soft. Bowel sounds are normal. She exhibits no distension and no mass. There is tenderness. There is no rebound and no guarding.  Musculoskeletal: She exhibits no edema and no tenderness.  Lymphadenopathy:    She has no cervical adenopathy.  Neurological: She displays normal reflexes. No cranial nerve deficit. She exhibits normal muscle tone. Coordination normal.  Skin: No rash noted. No erythema.  Psychiatric: She has a normal mood and affect. Her behavior is normal. Judgment and thought content normal.    Lab Results  Component Value Date   WBC 8.7 08/04/2013   HGB 12.5 08/04/2013   HCT 37.5 08/04/2013   PLT 311.0 08/04/2013   GLUCOSE 93 08/04/2013   CHOL 208* 08/04/2013   TRIG 76.0 08/04/2013   HDL 75.30 08/04/2013   LDLDIRECT 120.8 08/04/2013   LDLCALC 103* 02/24/2012   ALT 13 08/04/2013   AST 17 08/04/2013   NA 138 08/04/2013   K 3.8 08/04/2013   CL 104 08/04/2013   CREATININE 0.6 08/04/2013   BUN 7 08/04/2013   CO2 29 08/04/2013   TSH 0.87 08/04/2013         Assessment & Plan:

## 2014-01-03 ENCOUNTER — Telehealth: Payer: Self-pay | Admitting: Internal Medicine

## 2014-01-03 NOTE — Telephone Encounter (Signed)
Relevant patient education assigned to patient using Emmi. ° °

## 2014-01-17 ENCOUNTER — Ambulatory Visit
Admission: RE | Admit: 2014-01-17 | Discharge: 2014-01-17 | Disposition: A | Discharge status: Home / Self Care | Payer: BC Managed Care – PPO | Source: Ambulatory Visit

## 2014-01-17 DIAGNOSIS — Z1231 Encounter for screening mammogram for malignant neoplasm of breast: Secondary | ICD-10-CM

## 2014-02-10 ENCOUNTER — Telehealth: Payer: Self-pay | Admitting: Internal Medicine

## 2014-02-10 NOTE — Telephone Encounter (Signed)
lmomtcb x1 for Jordyn 

## 2014-02-11 ENCOUNTER — Ambulatory Visit: Payer: BC Managed Care – PPO | Admitting: Internal Medicine

## 2014-02-11 NOTE — Telephone Encounter (Signed)
Ok to cancel today, planning to schedule her in for allergy skin tests. We would need these before we could do anything like allergy shots. Please let her know that when Monica AddisonKatie is back, we will have [name] call her to schedule skin testing.

## 2014-02-11 NOTE — Telephone Encounter (Signed)
Last OV with CY: 02/19/13  Called, spoke with pt.  She has an appt scheduled today with CY.  Reports during last OV, she discusses restarting allergy shots but was advised CY would first need to see what she was allergic to.  Allergy profile was ordered:  Notes Recorded by Waymon Budgelinton D Young, MD on 03/10/2013 at 7:46 AM Allergy profile- The IgE allergy class antibodies are all in normal ranges, not pointing to any particular allergy triggers. We will discuss at next ov.   Pt states she would still like to see about restarting allergy injections.  She doesn't want to come in today if not needed and would like to know if she will need any skin testing or further testing to restart injections --- Dr. Maple HudsonYoung, pls advise if pt should keep this appt and/or schedule allergy test appt.  Thank you.

## 2014-02-11 NOTE — Telephone Encounter (Signed)
Called, spoke with pt.  Explained below per CY to her.  She verbalized understanding of this and is in agreement with this plan.  Pt aware Florentina AddisonKatie will be calling her next week sometime to schedule allergy skin testing.  [name], please advise once scheduled.  Thank you.

## 2014-02-14 NOTE — Telephone Encounter (Signed)
LMTCB

## 2014-02-15 NOTE — Telephone Encounter (Signed)
Spoke with patient-she is aware of appt date and time for 03-16-14 at 2pm; however patient states she can only get off Friday afternoons from work and will need to speak with her job tomorrow about this. Pt will call me back on Wednesday to arrange/finalize date and time for skin testing. Pt will ask to speak with me. Thanks.

## 2014-02-22 NOTE — Telephone Encounter (Signed)
Spoke with patient-she has not spoken with her job at this time and was wondering if we have an idea of how much the cost would be after insurance pays. I explained to patient that she could call our office back once she has spoken with her hob to schedule skin testing and also she would need to call her insurance company to get somewhat of an idea as far as cost. Pt verbalized her understanding and nothing else needed at this time.

## 2014-05-04 ENCOUNTER — Telehealth: Payer: Self-pay | Admitting: Internal Medicine

## 2014-05-04 MED ORDER — ESZOPICLONE 2 MG PO TABS: 1.0000 mg | ORAL_TABLET | Freq: Every evening | ORAL | Status: DC | PRN

## 2014-05-04 NOTE — Telephone Encounter (Signed)
Ok. See Rx Thx 

## 2014-05-04 NOTE — Telephone Encounter (Signed)
Patient would like to be [place].  [facility] [address]  Please advise.

## 2014-05-04 NOTE — Telephone Encounter (Signed)
Notified pt of md response. Faxed script to [facility]...Raechel Chute/lmb

## 2014-06-06 ENCOUNTER — Other Ambulatory Visit: Payer: Self-pay | Admitting: Internal Medicine

## 2014-07-01 ENCOUNTER — Telehealth: Payer: Self-pay | Admitting: Internal Medicine

## 2014-07-01 NOTE — Telephone Encounter (Signed)
Patient is requesting call back in regards to getting lab work done.

## 2014-07-01 NOTE — Telephone Encounter (Signed)
Called pt she stated she is coming in [place] for labs for cpx for next Friday 07/08/14. Pt wanted to see if md could see her Monday because she has been concern about her BP being elevated. 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 Inform pt md was already booked for Monday. Advise for her to keep monitoring her BP until she see him next week, and to get labs done too. Advise if over weekend BP tend to get higher, or have CP to go to ER for evaluation...Raechel Chute

## 2014-07-04 ENCOUNTER — Other Ambulatory Visit (INDEPENDENT_AMBULATORY_CARE_PROVIDER_SITE_OTHER): Payer: BC Managed Care – PPO

## 2014-07-04 DIAGNOSIS — Z0000 Encounter for general adult medical examination without abnormal findings: Secondary | ICD-10-CM

## 2014-07-04 LAB — HEPATIC FUNCTION PANEL
ALT: 14 U/L (ref 0–35)
AST: 21 U/L (ref 0–37)
Albumin: 4.3 g/dL (ref 3.5–5.2)
Alkaline Phosphatase: 47 U/L (ref 39–117)
BILIRUBIN DIRECT: 0 mg/dL (ref 0.0–0.3)
Total Bilirubin: 0.5 mg/dL (ref 0.2–1.2)
Total Protein: 7.7 g/dL (ref 6.0–8.3)

## 2014-07-04 LAB — URINALYSIS, ROUTINE W REFLEX MICROSCOPIC
Bilirubin Urine: NEGATIVE
HGB URINE DIPSTICK: NEGATIVE
Ketones, ur: NEGATIVE
Nitrite: NEGATIVE
RBC / HPF: NONE SEEN (ref 0–?)
SPECIFIC GRAVITY, URINE: 1.02 (ref 1.000–1.030)
Total Protein, Urine: NEGATIVE
URINE GLUCOSE: NEGATIVE
Urobilinogen, UA: 2 — AB (ref 0.0–1.0)
pH: 6.5 (ref 5.0–8.0)

## 2014-07-04 LAB — CBC WITH DIFFERENTIAL/PLATELET
BASOS ABS: 0 10*3/uL (ref 0.0–0.1)
Basophils Relative: 0.3 % (ref 0.0–3.0)
EOS PCT: 1 % (ref 0.0–5.0)
Eosinophils Absolute: 0.1 10*3/uL (ref 0.0–0.7)
HEMATOCRIT: 38 % (ref 36.0–46.0)
Hemoglobin: 12.5 g/dL (ref 12.0–15.0)
LYMPHS ABS: 2.4 10*3/uL (ref 0.7–4.0)
LYMPHS PCT: 36.4 % (ref 12.0–46.0)
MCHC: 32.9 g/dL (ref 30.0–36.0)
MCV: 86.8 fl (ref 78.0–100.0)
MONOS PCT: 6 % (ref 3.0–12.0)
Monocytes Absolute: 0.4 10*3/uL (ref 0.1–1.0)
Neutro Abs: 3.6 10*3/uL (ref 1.4–7.7)
Neutrophils Relative %: 56.3 % (ref 43.0–77.0)
Platelets: 310 10*3/uL (ref 150.0–400.0)
RBC: 4.38 Mil/uL (ref 3.87–5.11)
RDW: 14 % (ref 11.5–15.5)
WBC: 6.5 10*3/uL (ref 4.0–10.5)

## 2014-07-04 LAB — LIPID PANEL
CHOL/HDL RATIO: 3
Cholesterol: 203 mg/dL — ABNORMAL HIGH (ref 0–200)
HDL: 76.1 mg/dL (ref >39.00)
LDL Cholesterol: 112 mg/dL — ABNORMAL HIGH (ref 0–99)
NonHDL: 126.9
Triglycerides: 73 mg/dL (ref 0.0–149.0)
VLDL: 14.6 mg/dL (ref 0.0–40.0)

## 2014-07-04 LAB — BASIC METABOLIC PANEL
BUN: 8 mg/dL (ref 6–23)
CALCIUM: 10 mg/dL (ref 8.4–10.5)
CHLORIDE: 107 meq/L (ref 96–112)
CO2: 27 meq/L (ref 19–32)
Creatinine, Ser: 0.7 mg/dL (ref 0.4–1.2)
GFR: 116.27 mL/min (ref >60.00)
GLUCOSE: 90 mg/dL (ref 70–99)
Potassium: 3.6 mEq/L (ref 3.5–5.1)
SODIUM: 141 meq/L (ref 135–145)

## 2014-07-04 LAB — TSH: TSH: 1.17 u[IU]/mL (ref 0.35–4.50)

## 2014-07-08 ENCOUNTER — Encounter: Payer: Self-pay | Admitting: Internal Medicine

## 2014-07-08 ENCOUNTER — Ambulatory Visit (INDEPENDENT_AMBULATORY_CARE_PROVIDER_SITE_OTHER): Payer: BC Managed Care – PPO | Admitting: Internal Medicine

## 2014-07-08 VITALS — BP 132/90 | HR 87 | Temp 98.0°F | Ht 67.0 in | Wt 138.0 lb

## 2014-07-08 DIAGNOSIS — F439 Reaction to severe stress, unspecified: Secondary | ICD-10-CM

## 2014-07-08 DIAGNOSIS — I10 Essential (primary) hypertension: Secondary | ICD-10-CM

## 2014-07-08 DIAGNOSIS — Z733 Stress, not elsewhere classified: Secondary | ICD-10-CM

## 2014-07-08 DIAGNOSIS — G43909 Migraine, unspecified, not intractable, without status migrainosus: Secondary | ICD-10-CM

## 2014-07-08 DIAGNOSIS — Z0000 Encounter for general adult medical examination without abnormal findings: Secondary | ICD-10-CM

## 2014-07-08 MED ORDER — ESZOPICLONE 3 MG PO TABS: 1.5000 mg | ORAL_TABLET | Freq: Every day | ORAL | Status: DC

## 2014-07-08 MED ORDER — AMITRIPTYLINE HCL 25 MG PO TABS: 25.0000 mg | ORAL_TABLET | Freq: Every day | ORAL | Status: DC

## 2014-07-08 MED ORDER — DILTIAZEM HCL ER COATED BEADS 180 MG PO CP24: ORAL_CAPSULE | ORAL | Status: DC

## 2014-07-08 MED ORDER — SPIRONOLACTONE 25 MG PO TABS: 25.0000 mg | ORAL_TABLET | Freq: Every day | ORAL | Status: DC

## 2014-07-08 NOTE — Progress Notes (Signed)
   Subjective:   The patient is here for a wellness exam. C/o stress w/dad - colon ca w/liver mets  The patient needs to address  chronic hypertension that has been well controlled with medicines   HPI BP Readings from Last 3 Encounters:  07/08/14 132/90  12/31/13 128/92  08/04/13 128/94   Wt Readings from Last 3 Encounters:  07/08/14 138 lb (62.596 kg)  12/31/13 136 lb (61.689 kg)  08/04/13 144 lb (65.318 kg)      Review of Systems  Constitutional: Negative for chills, diaphoresis, activity change, appetite change, fatigue and unexpected weight change.  HENT: Negative for congestion, dental problem, mouth sores, postnasal drip, sneezing and voice change.   Eyes: Negative for visual disturbance.  Respiratory: Negative for chest tightness, wheezing and stridor.   Cardiovascular: Negative for leg swelling.  Gastrointestinal: Negative for blood in stool, abdominal distention and rectal pain.  Genitourinary: Negative for decreased urine volume, vaginal bleeding, vaginal discharge, difficulty urinating, vaginal pain and menstrual problem.  Musculoskeletal: Negative for gait problem and joint swelling.  Skin: Negative for color change, rash and wound.  Neurological: Negative for tremors, syncope, speech difficulty and light-headedness.  Hematological: Negative for adenopathy.  Psychiatric/Behavioral: Negative for suicidal ideas, hallucinations, behavioral problems, confusion, sleep disturbance, dysphoric mood and decreased concentration. The patient is not hyperactive.        Objective:   Physical Exam  Constitutional: She appears well-developed. No distress.  HENT:  Head: Normocephalic.  Right Ear: External ear normal.  Left Ear: External ear normal.  Nose: Nose normal.  Mouth/Throat: Oropharynx is clear and moist.  Eyes: Conjunctivae are normal. Pupils are equal, round, and reactive to light. Right eye exhibits no discharge. Left eye exhibits no discharge.  Neck: Normal  range of motion. Neck supple. No JVD present. No tracheal deviation present. No thyromegaly present.  Cardiovascular: Normal rate, regular rhythm and normal heart sounds.   Pulmonary/Chest: No stridor. No respiratory distress. She has no wheezes.  Abdominal: Soft. Bowel sounds are normal. She exhibits no distension and no mass. There is tenderness. There is no rebound and no guarding.  Musculoskeletal: She exhibits no edema and no tenderness.  Lymphadenopathy:    She has no cervical adenopathy.  Neurological: She displays normal reflexes. No cranial nerve deficit. She exhibits normal muscle tone. Coordination normal.  Skin: No rash noted. No erythema.  Psychiatric: She has a normal mood and affect. Her behavior is normal. Judgment and thought content normal.  Sad  Lab Results  Component Value Date   WBC 6.5 07/04/2014   HGB 12.5 07/04/2014   HCT 38.0 07/04/2014   PLT 310.0 07/04/2014   GLUCOSE 90 07/04/2014   CHOL 203* 07/04/2014   TRIG 73.0 07/04/2014   HDL 76.10 07/04/2014   LDLDIRECT 120.8 08/04/2013   LDLCALC 112* 07/04/2014   ALT 14 07/04/2014   AST 21 07/04/2014   NA 141 07/04/2014   K 3.6 07/04/2014   CL 107 07/04/2014   CREATININE 0.7 07/04/2014   BUN 8 07/04/2014   CO2 27 07/04/2014   TSH 1.17 07/04/2014         Assessment & Plan:

## 2014-07-08 NOTE — Progress Notes (Signed)
Pre visit review using our clinic review tool, if applicable. No additional management support is needed unless otherwise documented below in the visit note. 

## 2014-07-10 DIAGNOSIS — F439 Reaction to severe stress, unspecified: Secondary | ICD-10-CM | POA: Insufficient documentation

## 2014-07-10 NOTE — Assessment & Plan Note (Signed)
2015 sick father

## 2014-07-10 NOTE — Assessment & Plan Note (Signed)
Meds prn

## 2014-07-10 NOTE — Assessment & Plan Note (Signed)
Continue with current prescription therapy as reflected on the Med list.  

## 2014-07-10 NOTE — Assessment & Plan Note (Addendum)
We discussed age appropriate health related issues, including available/recomended screening tests and vaccinations. We discussed a need for adhering to healthy diet and exercise. Labs/EKG were reviewed/ordered. All questions were answered.  Labs 

## 2014-11-08 ENCOUNTER — Telehealth: Payer: Self-pay | Admitting: *Deleted

## 2014-11-08 MED ORDER — OMEPRAZOLE 20 MG PO CPDR: 20.0000 mg | DELAYED_RELEASE_CAPSULE | Freq: Every day | ORAL | Status: DC

## 2014-11-08 NOTE — Telephone Encounter (Signed)
Pt requesting samples of dexilant. Inform pt we no longer get samples. She stated her acid reflux is acting up md had given samples and they really work. [facility].../lmb

## 2014-11-14 ENCOUNTER — Telehealth: Payer: Self-pay | Admitting: Internal Medicine

## 2014-11-14 NOTE — Telephone Encounter (Signed)
Pt called in wanted to know if Dr Macario Goldsplot could put referral for CT in for her stomach.  She said that he referred her last year but she never went.  Can she just have it put back in or does she need to be seen?

## 2014-11-16 NOTE — Telephone Encounter (Signed)
Last OV in Sept. Pls sch OV Thx

## 2014-11-17 NOTE — Telephone Encounter (Signed)
Per shana she has called pt no answer she left msg on vm with md response...Raechel Chute/lmb

## 2014-12-03 ENCOUNTER — Other Ambulatory Visit: Payer: Self-pay | Admitting: Internal Medicine

## 2014-12-26 ENCOUNTER — Other Ambulatory Visit: Payer: Self-pay

## 2014-12-26 DIAGNOSIS — Z1231 Encounter for screening mammogram for malignant neoplasm of breast: Secondary | ICD-10-CM

## 2015-01-15 ENCOUNTER — Other Ambulatory Visit: Payer: Self-pay | Admitting: Internal Medicine

## 2015-01-17 NOTE — Telephone Encounter (Signed)
Patient needs refill for Eszopiclone (ESZOPICLONE) 3 MG TABS [[id number]. [facility] and [facility] needs this prescription today.

## 2015-01-18 NOTE — Telephone Encounter (Signed)
Rf phoned in.  

## 2015-01-20 ENCOUNTER — Ambulatory Visit: Payer: Self-pay

## 2015-01-30 ENCOUNTER — Ambulatory Visit
Admission: RE | Admit: 2015-01-30 | Discharge: 2015-01-30 | Disposition: A | Discharge status: Home / Self Care | Payer: BLUE CROSS/BLUE SHIELD | Source: Ambulatory Visit

## 2015-01-30 DIAGNOSIS — Z1231 Encounter for screening mammogram for malignant neoplasm of breast: Secondary | ICD-10-CM

## 2015-02-03 ENCOUNTER — Ambulatory Visit: Payer: Self-pay

## 2015-02-10 ENCOUNTER — Ambulatory Visit (INDEPENDENT_AMBULATORY_CARE_PROVIDER_SITE_OTHER): Payer: BLUE CROSS/BLUE SHIELD | Admitting: Internal Medicine

## 2015-02-10 ENCOUNTER — Encounter: Payer: Self-pay | Admitting: Internal Medicine

## 2015-02-10 VITALS — BP 140/90 | HR 81 | Wt 139.0 lb

## 2015-02-10 DIAGNOSIS — I10 Essential (primary) hypertension: Secondary | ICD-10-CM

## 2015-02-10 DIAGNOSIS — M5489 Other dorsalgia: Secondary | ICD-10-CM

## 2015-02-10 DIAGNOSIS — K219 Gastro-esophageal reflux disease without esophagitis: Secondary | ICD-10-CM

## 2015-02-10 MED ORDER — FLUCONAZOLE 150 MG PO TABS: 150.0000 mg | ORAL_TABLET | Freq: Every day | ORAL | Status: DC

## 2015-02-10 MED ORDER — TRAMADOL HCL 50 MG PO TABS: 50.0000 mg | ORAL_TABLET | Freq: Two times a day (BID) | ORAL | Status: DC | PRN

## 2015-02-10 MED ORDER — AZITHROMYCIN 250 MG PO TABS: ORAL_TABLET | ORAL | Status: DC

## 2015-02-10 NOTE — Progress Notes (Signed)
   Subjective:   F/u stress w/dad - colon ca w/liver mets  The patient needs to address  chronic hypertension that normally has been well controlled with medicines   Back Pain This is a recurrent problem. The pain is present in the lumbar spine. Pertinent negatives include no abdominal pain. Risk factors: scoliosis surgery at 44 yo - all thor and lumbar spine.  Sinus Problem Pertinent negatives include no chills, congestion, diaphoresis or sneezing.   BP Readings from Last 3 Encounters:  02/10/15 140/90  07/08/14 132/90  12/31/13 128/92   Wt Readings from Last 3 Encounters:  02/10/15 139 lb (63.05 kg)  07/08/14 138 lb (62.596 kg)  12/31/13 136 lb (61.689 kg)      Review of Systems  Constitutional: Negative for chills, diaphoresis, activity change, appetite change, fatigue and unexpected weight change.  HENT: Negative for congestion, dental problem, mouth sores, postnasal drip, sneezing and voice change.   Eyes: Negative for visual disturbance.  Respiratory: Negative for chest tightness, wheezing and stridor.   Cardiovascular: Negative for leg swelling.  Gastrointestinal: Negative for abdominal pain, blood in stool, abdominal distention and rectal pain.  Genitourinary: Negative for decreased urine volume, vaginal bleeding, vaginal discharge, difficulty urinating, vaginal pain and menstrual problem.  Musculoskeletal: Positive for back pain. Negative for joint swelling and gait problem.  Skin: Negative for color change, rash and wound.  Neurological: Negative for tremors, syncope, speech difficulty and light-headedness.  Hematological: Negative for adenopathy.  Psychiatric/Behavioral: Negative for suicidal ideas, hallucinations, behavioral problems, confusion, sleep disturbance, dysphoric mood and decreased concentration. The patient is not hyperactive.        Objective:   Physical Exam  Constitutional: She appears well-developed. No distress.  HENT:  Head: Normocephalic.   Right Ear: External ear normal.  Left Ear: External ear normal.  Nose: Nose normal.  Mouth/Throat: Oropharynx is clear and moist.  Eyes: Conjunctivae are normal. Pupils are equal, round, and reactive to light. Right eye exhibits no discharge. Left eye exhibits no discharge.  Neck: Normal range of motion. Neck supple. No JVD present. No tracheal deviation present. No thyromegaly present.  Cardiovascular: Normal rate, regular rhythm and normal heart sounds.   Pulmonary/Chest: No stridor. No respiratory distress. She has no wheezes.  Abdominal: Soft. Bowel sounds are normal. She exhibits no distension and no mass. There is tenderness. There is no rebound and no guarding.  Musculoskeletal: She exhibits no edema or tenderness.  Lymphadenopathy:    She has no cervical adenopathy.  Neurological: She displays normal reflexes. No cranial nerve deficit. She exhibits normal muscle tone. Coordination normal.  Skin: No rash noted. No erythema.  Psychiatric: She has a normal mood and affect. Her behavior is normal. Judgment and thought content normal.  Scars along spine  Lab Results  Component Value Date   WBC 6.5 07/04/2014   HGB 12.5 07/04/2014   HCT 38.0 07/04/2014   PLT 310.0 07/04/2014   GLUCOSE 90 07/04/2014   CHOL 203* 07/04/2014   TRIG 73.0 07/04/2014   HDL 76.10 07/04/2014   LDLDIRECT 120.8 08/04/2013   LDLCALC 112* 07/04/2014   ALT 14 07/04/2014   AST 21 07/04/2014   NA 141 07/04/2014   K 3.6 07/04/2014   CL 107 07/04/2014   CREATININE 0.7 07/04/2014   BUN 8 07/04/2014   CO2 27 07/04/2014   TSH 1.17 07/04/2014         Assessment & Plan:

## 2015-02-10 NOTE — Progress Notes (Signed)
Pre visit review using our clinic review tool, if applicable. No additional management support is needed unless otherwise documented below in the visit note. 

## 2015-02-16 ENCOUNTER — Other Ambulatory Visit: Payer: Self-pay | Admitting: *Deleted

## 2015-02-16 MED ORDER — AMITRIPTYLINE HCL 25 MG PO TABS: 25.0000 mg | ORAL_TABLET | Freq: Every day | ORAL | Status: DC

## 2015-02-16 NOTE — Telephone Encounter (Signed)
OK to fill this prescription with additional refills x 12 mo Thank you!  

## 2015-02-16 NOTE — Telephone Encounter (Signed)
[facility]lmb 

## 2015-02-16 NOTE — Telephone Encounter (Signed)
Rf req for Amitriptyline 25 mg 1-2 po qhs. # 180. Last filled 01/17/15. Ok to Rf?

## 2015-02-22 DIAGNOSIS — M549 Dorsalgia, unspecified: Secondary | ICD-10-CM | POA: Insufficient documentation

## 2015-02-22 NOTE — Assessment & Plan Note (Signed)
Monitor at work Diltazem, Spironolactone

## 2015-02-22 NOTE — Assessment & Plan Note (Signed)
On Prilosec

## 2015-04-26 ENCOUNTER — Other Ambulatory Visit: Payer: Self-pay

## 2015-04-26 MED ORDER — DILTIAZEM HCL ER COATED BEADS 180 MG PO CP24: 180.0000 mg | ORAL_CAPSULE | Freq: Every day | ORAL | Status: DC

## 2015-05-08 ENCOUNTER — Other Ambulatory Visit (INDEPENDENT_AMBULATORY_CARE_PROVIDER_SITE_OTHER): Payer: BLUE CROSS/BLUE SHIELD

## 2015-05-08 ENCOUNTER — Telehealth: Payer: Self-pay | Admitting: Internal Medicine

## 2015-05-08 DIAGNOSIS — Z0000 Encounter for general adult medical examination without abnormal findings: Secondary | ICD-10-CM

## 2015-05-08 LAB — BASIC METABOLIC PANEL
BUN: 8 mg/dL (ref 6–23)
CO2: 28 meq/L (ref 19–32)
Calcium: 9.5 mg/dL (ref 8.4–10.5)
Chloride: 105 mEq/L (ref 96–112)
Creatinine, Ser: 0.72 mg/dL (ref 0.40–1.20)
GFR: 112.13 mL/min (ref >60.00)
Glucose, Bld: 87 mg/dL (ref 70–99)
POTASSIUM: 3.8 meq/L (ref 3.5–5.1)
SODIUM: 140 meq/L (ref 135–145)

## 2015-05-08 LAB — CBC WITH DIFFERENTIAL/PLATELET
Basophils Absolute: 0 10*3/uL (ref 0.0–0.1)
Basophils Relative: 0.5 % (ref 0.0–3.0)
EOS ABS: 0.1 10*3/uL (ref 0.0–0.7)
Eosinophils Relative: 1.5 % (ref 0.0–5.0)
HEMATOCRIT: 37.9 % (ref 36.0–46.0)
Hemoglobin: 12.6 g/dL (ref 12.0–15.0)
LYMPHS PCT: 38.3 % (ref 12.0–46.0)
Lymphs Abs: 2.1 10*3/uL (ref 0.7–4.0)
MCHC: 33.4 g/dL (ref 30.0–36.0)
MCV: 86.1 fl (ref 78.0–100.0)
MONO ABS: 0.3 10*3/uL (ref 0.1–1.0)
Monocytes Relative: 5.3 % (ref 3.0–12.0)
NEUTROS PCT: 54.4 % (ref 43.0–77.0)
Neutro Abs: 2.9 10*3/uL (ref 1.4–7.7)
Platelets: 343 10*3/uL (ref 150.0–400.0)
RBC: 4.4 Mil/uL (ref 3.87–5.11)
RDW: 14.3 % (ref 11.5–15.5)
WBC: 5.4 10*3/uL (ref 4.0–10.5)

## 2015-05-08 LAB — URINALYSIS
Bilirubin Urine: NEGATIVE
Hgb urine dipstick: NEGATIVE
Ketones, ur: NEGATIVE
Leukocytes, UA: NEGATIVE
NITRITE: NEGATIVE
SPECIFIC GRAVITY, URINE: 1.02 (ref 1.000–1.030)
Total Protein, Urine: NEGATIVE
Urine Glucose: NEGATIVE
Urobilinogen, UA: 0.2 (ref 0.0–1.0)
pH: 6.5 (ref 5.0–8.0)

## 2015-05-08 LAB — LIPID PANEL
CHOL/HDL RATIO: 3
Cholesterol: 206 mg/dL — ABNORMAL HIGH (ref 0–200)
HDL: 70 mg/dL (ref >39.00)
LDL Cholesterol: 123 mg/dL — ABNORMAL HIGH (ref 0–99)
NonHDL: 136
TRIGLYCERIDES: 63 mg/dL (ref 0.0–149.0)
VLDL: 12.6 mg/dL (ref 0.0–40.0)

## 2015-05-08 LAB — HEPATIC FUNCTION PANEL
ALT: 10 U/L (ref 0–35)
AST: 16 U/L (ref 0–37)
Albumin: 4.3 g/dL (ref 3.5–5.2)
Alkaline Phosphatase: 55 U/L (ref 39–117)
BILIRUBIN DIRECT: 0.1 mg/dL (ref 0.0–0.3)
BILIRUBIN TOTAL: 0.3 mg/dL (ref 0.2–1.2)
TOTAL PROTEIN: 6.9 g/dL (ref 6.0–8.3)

## 2015-05-08 LAB — TSH: TSH: 1.56 u[IU]/mL (ref 0.35–4.50)

## 2015-05-08 NOTE — Telephone Encounter (Signed)
Labs for wellness

## 2015-05-12 ENCOUNTER — Ambulatory Visit (INDEPENDENT_AMBULATORY_CARE_PROVIDER_SITE_OTHER): Payer: BLUE CROSS/BLUE SHIELD | Admitting: Internal Medicine

## 2015-05-12 ENCOUNTER — Encounter: Payer: Self-pay | Admitting: Internal Medicine

## 2015-05-12 ENCOUNTER — Ambulatory Visit (INDEPENDENT_AMBULATORY_CARE_PROVIDER_SITE_OTHER)
Admission: RE | Admit: 2015-05-12 | Discharge: 2015-05-12 | Disposition: A | Discharge status: Home / Self Care | Payer: BLUE CROSS/BLUE SHIELD | Source: Ambulatory Visit | Attending: Internal Medicine | Admitting: Internal Medicine

## 2015-05-12 VITALS — BP 110/80 | HR 90 | Ht 67.0 in | Wt 141.0 lb

## 2015-05-12 DIAGNOSIS — Z0000 Encounter for general adult medical examination without abnormal findings: Secondary | ICD-10-CM

## 2015-05-12 DIAGNOSIS — M5440 Lumbago with sciatica, unspecified side: Secondary | ICD-10-CM | POA: Diagnosis not present

## 2015-05-12 MED ORDER — SPIRONOLACTONE 25 MG PO TABS: 25.0000 mg | ORAL_TABLET | Freq: Every day | ORAL | Status: DC

## 2015-05-12 MED ORDER — VITAMIN D3 50 MCG (2000 UT) PO CAPS: 2000.0000 [IU] | ORAL_CAPSULE | Freq: Every day | ORAL | Status: DC

## 2015-05-12 MED ORDER — ESZOPICLONE 3 MG PO TABS: 3.0000 mg | ORAL_TABLET | Freq: Every day | ORAL | Status: DC

## 2015-05-12 NOTE — Patient Instructions (Signed)
TRE - trauma release exercises [employer]

## 2015-05-12 NOTE — Assessment & Plan Note (Signed)
S/p extensive scoliosis surgery as a teenager (thoracic and LS spine).  Chronic pain Tramadol Try TRE PT discussed X rays Vit D

## 2015-05-12 NOTE — Progress Notes (Signed)
Subjective:  Patient ID: [name], female    DOB: 1970-11-30  Age: 44 y.o. MRN: [id number]  CC: Annual Exam   HPI [name] presents for well exam.  C/o LBP and and R hip pain.  Outpatient Prescriptions Prior to Visit  Medication Sig Dispense Refill  . amitriptyline (ELAVIL) 25 MG tablet Take 1-2 tablets (25-50 mg total) by mouth at bedtime. 180 tablet 3  . aspirin 81 MG tablet Take 81 mg by mouth daily.      [name] diltiazem (CARDIZEM CD) 180 MG 24 hr capsule Take 1 capsule (180 mg total) by mouth daily. 90 capsule 1  . omeprazole (PRILOSEC) 20 MG capsule Take 1 capsule (20 mg total) by mouth daily. 30 capsule 11  . azithromycin (ZITHROMAX) 250 MG tablet As directed 6 tablet 0  . Eszopiclone 3 MG TABS TAKE 1 TABLET BY MOUTH AT BEDTIME 90 tablet 1  . spironolactone (ALDACTONE) 25 MG tablet Take 1 tablet (25 mg total) by mouth daily. 90 tablet 2  . acetaminophen (TYLENOL) 500 MG tablet Take 500 mg by mouth daily as needed. For headache    . fish oil-omega-3 fatty acids 1000 MG capsule Take 1 g by mouth daily.    . fluconazole (DIFLUCAN) 150 MG tablet Take 1 tablet (150 mg total) by mouth daily. (Patient not taking: Reported on 05/12/2015) 1 tablet 1  . traMADol (ULTRAM) 50 MG tablet Take 1-2 tablets (50-100 mg total) by mouth 2 (two) times daily as needed for severe pain. (Patient not taking: Reported on 05/12/2015) 60 tablet 3   No facility-administered medications prior to visit.    ROS Review of Systems  Constitutional: Positive for fatigue. Negative for chills, activity change, appetite change and unexpected weight change.  HENT: Negative for congestion, mouth sores and sinus pressure.   Eyes: Negative for visual disturbance.  Respiratory: Negative for cough and chest tightness.   Gastrointestinal: Negative for nausea and abdominal pain.  Genitourinary: Negative for frequency, difficulty urinating and vaginal pain.  Musculoskeletal: Positive for back pain and arthralgias.  Negative for gait problem and neck pain.  Skin: Negative for pallor and rash.  Neurological: Negative for dizziness, tremors, weakness, numbness and headaches.  Psychiatric/Behavioral: Negative for confusion and sleep disturbance.    Objective:  BP 110/80 mmHg  Pulse 90  Ht  (1.702 m)  Wt 141 lb (63.957 kg)  BMI 22.08 kg/m2  SpO2 98%  BP Readings from Last 3 Encounters:  05/12/15 110/80  02/10/15 140/90  07/08/14 132/90    Wt Readings from Last 3 Encounters:  05/12/15 141 lb (63.957 kg)  02/10/15 139 lb (63.05 kg)  07/08/14 138 lb (62.596 kg)    Physical Exam  Constitutional: She appears well-developed. No distress.  HENT:  Head: Normocephalic.  Right Ear: External ear normal.  Left Ear: External ear normal.  Nose: Nose normal.  Mouth/Throat: Oropharynx is clear and moist.  Eyes: Conjunctivae are normal. Pupils are equal, round, and reactive to light. Right eye exhibits no discharge. Left eye exhibits no discharge.  Neck: Normal range of motion. Neck supple. No JVD present. No tracheal deviation present. No thyromegaly present.  Cardiovascular: Normal rate, regular rhythm and normal heart sounds.   Pulmonary/Chest: No stridor. No respiratory distress. She has no wheezes.  Abdominal: Soft. Bowel sounds are normal. She exhibits no distension and no mass. There is no tenderness. There is no rebound and no guarding.  Musculoskeletal: She exhibits tenderness. She exhibits no edema.  Lymphadenopathy:  She has no cervical adenopathy.  Neurological: She displays normal reflexes. No cranial nerve deficit. She exhibits normal muscle tone. Coordination normal.  Skin: No rash noted. No erythema.  Psychiatric: She has a normal mood and affect. Her behavior is normal. Judgment and thought content normal.  LS tender w/ROM Lng spine scar  Lab Results  Component Value Date   WBC 5.4 05/08/2015   HGB 12.6 05/08/2015   HCT 37.9 05/08/2015   PLT 343.0 05/08/2015   GLUCOSE 87  05/08/2015   CHOL 206* 05/08/2015   TRIG 63.0 05/08/2015   HDL 70.00 05/08/2015   LDLDIRECT 120.8 08/04/2013   LDLCALC 123* 05/08/2015   ALT 10 05/08/2015   AST 16 05/08/2015   NA 140 05/08/2015   K 3.8 05/08/2015   CL 105 05/08/2015   CREATININE 0.72 05/08/2015   BUN 8 05/08/2015   CO2 28 05/08/2015   TSH 1.56 05/08/2015    Mm Digital Screening Bilateral  01/31/2015   CLINICAL DATA:  Screening.  EXAM: DIGITAL SCREENING BILATERAL MAMMOGRAM WITH CAD  COMPARISON:  Previous exam(s).  ACR Breast Density Category c: The breast tissue is heterogeneously dense, which may obscure small masses.  FINDINGS: There are no findings suspicious for malignancy. Images were processed with CAD.  IMPRESSION: No mammographic evidence of malignancy. A result letter of this screening mammogram will be mailed directly to the patient.  RECOMMENDATION: Screening mammogram in one year. (Code:SM-B-01Y)  BI-RADS CATEGORY  1: Negative.   Electronically Signed   By: Rolla Plate M.D.   On: 01/31/2015 14:02    Assessment & Plan:   [name] was seen today for annual exam.  Diagnoses and all orders for this visit:  Midline low back pain with sciatica, sciatica laterality unspecified Orders: -     DG Thoracic Spine 2 View; Future -     DG Lumbar Spine 2-3 Views; Future  Well adult exam  Other orders -     spironolactone (ALDACTONE) 25 MG tablet; Take 1 tablet (25 mg total) by mouth daily. -     Eszopiclone 3 MG TABS; Take 1 tablet (3 mg total) by mouth at bedtime. Take immediately before bedtime -     Cholecalciferol (VITAMIN D3) 2000 UNITS capsule; Take 1 capsule (2,000 Units total) by mouth daily.  I have discontinued [name]'s azithromycin. I have also changed her Eszopiclone. Additionally, I am having her start on Vitamin D3. Lastly, I am having her maintain her aspirin, fish oil-omega-3 fatty acids, acetaminophen, omeprazole, traMADol, fluconazole, amitriptyline, diltiazem, and spironolactone.  Meds  ordered this encounter  Medications  . spironolactone (ALDACTONE) 25 MG tablet    Sig: Take 1 tablet (25 mg total) by mouth daily.    Dispense:  30 tablet    Refill:  11  . Eszopiclone 3 MG TABS    Sig: Take 1 tablet (3 mg total) by mouth at bedtime. Take immediately before bedtime    Dispense:  30 tablet    Refill:  5  . Cholecalciferol (VITAMIN D3) 2000 UNITS capsule    Sig: Take 1 capsule (2,000 Units total) by mouth daily.    Dispense:  100 capsule    Refill:  3     Follow-up: Return in about 6 months (around 11/12/2015) for a follow-up visit.  Sonda Primes, MD

## 2015-05-12 NOTE — Progress Notes (Signed)
Pre visit review using our clinic review tool, if applicable. No additional management support is needed unless otherwise documented below in the visit note. 

## 2015-05-12 NOTE — Assessment & Plan Note (Signed)
We discussed age appropriate health related issues, including available/recomended screening tests and vaccinations. We discussed a need for adhering to healthy diet and exercise. Labs/EKG were reviewed/ordered. All questions were answered.   

## 2015-05-14 ENCOUNTER — Other Ambulatory Visit: Payer: Self-pay | Admitting: Internal Medicine

## 2015-05-21 ENCOUNTER — Encounter: Payer: Self-pay | Admitting: Internal Medicine

## 2015-05-29 ENCOUNTER — Telehealth: Payer: Self-pay | Admitting: *Deleted

## 2015-05-29 MED ORDER — ESCITALOPRAM OXALATE 10 MG PO TABS: 10.0000 mg | ORAL_TABLET | Freq: Every day | ORAL | Status: DC

## 2015-05-29 NOTE — Telephone Encounter (Signed)
Left msg on triage stating saw md 05/12/15 for annual physical wanting md to rx her something for anxiety. Pls advise...Raechel Chute

## 2015-05-29 NOTE — Telephone Encounter (Signed)
Start Lexapro 10 mg/d in am. Take 1/2 tab a day x 1 wk, then 1 a day Thx

## 2015-05-30 MED ORDER — ALPRAZOLAM 0.25 MG PO TABS: 0.2500 mg | ORAL_TABLET | Freq: Two times a day (BID) | ORAL | Status: DC | PRN

## 2015-05-30 NOTE — Telephone Encounter (Signed)
Notified pt with md response. Pt states at her visit she talk with md about her dad he know he has cancer & he is not been doing well. Pt states she wanted something that will help her sleep as well. Requesting to have xanax. Pls advise...[name]

## 2015-05-30 NOTE — Telephone Encounter (Signed)
Ok Xanax prn Thx

## 2015-05-31 NOTE — Telephone Encounter (Signed)
[facility] spoke with christy/pharmacist gave md approval. Called pt no answer LMOM [facility].../lmb

## 2015-07-25 ENCOUNTER — Ambulatory Visit: Payer: BLUE CROSS/BLUE SHIELD | Admitting: Podiatry

## 2015-08-18 ENCOUNTER — Telehealth: Payer: Self-pay | Admitting: Internal Medicine

## 2015-08-18 NOTE — Telephone Encounter (Signed)
[facility] [address] they are trying to fill a refill for Eszopiclone 3 MG TABS [[id number] but the medicine is on mfg backorder with no date of when they will get them.  They want to know what you want to do.

## 2015-08-21 NOTE — Telephone Encounter (Signed)
Patient has now called regarding the note below. Please call pharmacy

## 2015-08-22 NOTE — Telephone Encounter (Signed)
OK w/me: Lunesta 1 mg to take 3 tab at hs prn #90 with 5 ref Thx

## 2015-08-22 NOTE — Telephone Encounter (Signed)
Can she use Zolpidem? Thx

## 2015-08-22 NOTE — Telephone Encounter (Signed)
Pt wants to know if you will prescribe Lunesta 1 mg tabs and have her take 3 tabs. She does not want Zolpidem. Please advise.

## 2015-08-23 MED ORDER — ESZOPICLONE 1 MG PO TABS: 3.0000 mg | ORAL_TABLET | Freq: Every evening | ORAL | Status: DC | PRN

## 2015-08-23 NOTE — Telephone Encounter (Signed)
New Rx phoned in. See meds. Pt informed  

## 2015-10-29 ENCOUNTER — Other Ambulatory Visit: Payer: Self-pay | Admitting: Internal Medicine

## 2015-11-10 ENCOUNTER — Ambulatory Visit: Payer: BLUE CROSS/BLUE SHIELD | Admitting: Internal Medicine

## 2015-12-11 ENCOUNTER — Ambulatory Visit: Payer: BLUE CROSS/BLUE SHIELD | Admitting: Internal Medicine

## 2015-12-20 ENCOUNTER — Other Ambulatory Visit: Payer: Self-pay | Admitting: Internal Medicine

## 2015-12-29 ENCOUNTER — Encounter: Payer: Self-pay | Admitting: Internal Medicine

## 2015-12-29 ENCOUNTER — Ambulatory Visit (INDEPENDENT_AMBULATORY_CARE_PROVIDER_SITE_OTHER): Payer: BLUE CROSS/BLUE SHIELD | Admitting: Internal Medicine

## 2015-12-29 VITALS — BP 120/90 | HR 70 | Temp 99.0°F | Resp 18 | Ht 67.0 in | Wt 142.0 lb

## 2015-12-29 DIAGNOSIS — I10 Essential (primary) hypertension: Secondary | ICD-10-CM | POA: Diagnosis not present

## 2015-12-29 DIAGNOSIS — Z658 Other specified problems related to psychosocial circumstances: Secondary | ICD-10-CM | POA: Diagnosis not present

## 2015-12-29 DIAGNOSIS — M5440 Lumbago with sciatica, unspecified side: Secondary | ICD-10-CM | POA: Diagnosis not present

## 2015-12-29 DIAGNOSIS — F439 Reaction to severe stress, unspecified: Secondary | ICD-10-CM

## 2015-12-29 MED ORDER — ESCITALOPRAM OXALATE 10 MG PO TABS: 10.0000 mg | ORAL_TABLET | Freq: Every day | ORAL | Status: DC

## 2015-12-29 MED ORDER — ALPRAZOLAM 0.25 MG PO TABS: 0.2500 mg | ORAL_TABLET | Freq: Two times a day (BID) | ORAL | Status: DC | PRN

## 2015-12-29 NOTE — Progress Notes (Signed)
Subjective:  Patient ID: [name], female    DOB: 08-31-1971  Age: 45 y.o. MRN: [id number]  CC: No chief complaint on file.   HPI [name] presents for stress. Dad is sick with cancer. She is very upset .. Kids are grown and they don't help. F/u LBP, scoliosis. F/u HTN  Outpatient Prescriptions Prior to Visit  Medication Sig Dispense Refill  . acetaminophen (TYLENOL) 500 MG tablet Take 500 mg by mouth daily as needed. For headache    . amitriptyline (ELAVIL) 25 MG tablet Take 1-2 tablets (25-50 mg total) by mouth at bedtime. 180 tablet 3  . aspirin 81 MG tablet Take 81 mg by mouth daily.      . Cholecalciferol (VITAMIN D3) 2000 UNITS capsule Take 1 capsule (2,000 Units total) by mouth daily. 100 capsule 3  . diltiazem (CARDIZEM CD) 180 MG 24 hr capsule TAKE 1 CAPSULE (180 MG TOTAL) BY MOUTH DAILY. 90 capsule 1  . eszopiclone (LUNESTA) 1 MG TABS tablet Take 3 tablets (3 mg total) by mouth at bedtime as needed for sleep. Take immediately before bedtime 90 tablet 5  . fish oil-omega-3 fatty acids 1000 MG capsule Take 1 g by mouth daily.    . fluconazole (DIFLUCAN) 150 MG tablet Take 1 tablet (150 mg total) by mouth daily. (Patient not taking: Reported on 05/12/2015) 1 tablet 1  . omeprazole (PRILOSEC) 20 MG capsule TAKE 1 CAPSULE (20 MG TOTAL) BY MOUTH DAILY. 30 capsule 7  . spironolactone (ALDACTONE) 25 MG tablet Take 1 tablet (25 mg total) by mouth daily. 30 tablet 11  . traMADol (ULTRAM) 50 MG tablet Take 1-2 tablets (50-100 mg total) by mouth 2 (two) times daily as needed for severe pain. (Patient not taking: Reported on 05/12/2015) 60 tablet 3  . ALPRAZolam (XANAX) 0.25 MG tablet Take 1 tablet (0.25 mg total) by mouth 2 (two) times daily as needed for anxiety or sleep. 60 tablet 2  . escitalopram (LEXAPRO) 10 MG tablet Take 1 tablet (10 mg total) by mouth daily. 30 tablet 5   No facility-administered medications prior to visit.    ROS Review of Systems  Constitutional:  Negative for chills, activity change, appetite change, fatigue and unexpected weight change.  HENT: Negative for congestion, mouth sores and sinus pressure.   Eyes: Negative for visual disturbance.  Respiratory: Negative for cough and chest tightness.   Gastrointestinal: Negative for nausea and abdominal pain.  Genitourinary: Negative for frequency, difficulty urinating and vaginal pain.  Musculoskeletal: Positive for back pain and arthralgias. Negative for gait problem.  Skin: Negative for pallor and rash.  Neurological: Negative for dizziness, tremors, weakness, numbness and headaches.  Psychiatric/Behavioral: Positive for dysphoric mood. Negative for suicidal ideas, confusion and sleep disturbance. The patient is nervous/anxious.     Objective:  BP 120/90 mmHg  Pulse 70  Temp(Src) 99 F (37.2 C) (Oral)  Resp 18  Ht  (1.702 m)  Wt 142 lb (64.411 kg)  BMI 22.24 kg/m2  SpO2 97%  BP Readings from Last 3 Encounters:  12/29/15 120/90  05/12/15 110/80  02/10/15 140/90    Wt Readings from Last 3 Encounters:  12/29/15 142 lb (64.411 kg)  05/12/15 141 lb (63.957 kg)  02/10/15 139 lb (63.05 kg)    Physical Exam  Constitutional: She appears well-developed. No distress.  HENT:  Head: Normocephalic.  Right Ear: External ear normal.  Left Ear: External ear normal.  Nose: Nose normal.  Mouth/Throat: Oropharynx is clear and moist.  Eyes: Conjunctivae are normal. Pupils are equal, round, and reactive to light. Right eye exhibits no discharge. Left eye exhibits no discharge.  Neck: Normal range of motion. Neck supple. No JVD present. No tracheal deviation present. No thyromegaly present.  Cardiovascular: Normal rate, regular rhythm and normal heart sounds.   Pulmonary/Chest: No stridor. No respiratory distress. She has no wheezes.  Abdominal: Soft. Bowel sounds are normal. She exhibits no distension and no mass. There is no tenderness. There is no rebound and no guarding.    Musculoskeletal: She exhibits tenderness. She exhibits no edema.  Lymphadenopathy:    She has no cervical adenopathy.  Neurological: She displays normal reflexes. No cranial nerve deficit. She exhibits normal muscle tone. Coordination normal.  Skin: No rash noted. No erythema.  Psychiatric: Her behavior is normal. Judgment and thought content normal.    Lab Results  Component Value Date   WBC 5.4 05/08/2015   HGB 12.6 05/08/2015   HCT 37.9 05/08/2015   PLT 343.0 05/08/2015   GLUCOSE 87 05/08/2015   CHOL 206* 05/08/2015   TRIG 63.0 05/08/2015   HDL 70.00 05/08/2015   LDLDIRECT 120.8 08/04/2013   LDLCALC 123* 05/08/2015   ALT 10 05/08/2015   AST 16 05/08/2015   NA 140 05/08/2015   K 3.8 05/08/2015   CL 105 05/08/2015   CREATININE 0.72 05/08/2015   BUN 8 05/08/2015   CO2 28 05/08/2015   TSH 1.56 05/08/2015    Dg Thoracic Spine 2 View  05/12/2015  CLINICAL DATA:  Chronic low back pain, worse over the past 2 months, history of scoliosis and corrective surgery. EXAM: THORACIC SPINE 2 VIEWS COMPARISON:  Chest x-ray of June 13, 2004 FINDINGS: A Harrington rod is in place from approximately T2-L2. Moderate S shaped thoracolumbar scoliosis is present and stable. No compression fractures are observed. There are no abnormal paravertebral soft tissue densities. IMPRESSION: Stable S-shaped thoracolumbar scoliosis. The Harrington rod is intact. No acute abnormality is observed. Electronically Signed   By: David  SwazilandJordan M.D.   On: 05/12/2015 14:53   Dg Lumbar Spine 2-3 Views  05/12/2015  CLINICAL DATA:  Chronic back pain worsening over the past 2 months. No injury. History of scoliosis. EXAM: LUMBAR SPINE - 2-3 VIEW COMPARISON:  None. FINDINGS: Exam demonstrates mild to moderate curvature of the lumbar spine convex left. Stabilization rod is present extending from the thoracic spine to the L3 level. There is mild spondylosis throughout the lumbar spine with disc space narrowing at the L4-5  level. No evidence of compression fracture or subluxation. IMPRESSION: Mild spondylosis of the lumbar spine with mild to moderate curvature convex left. Stabilization rod intact with inferior anchor at the L3 level. Electronically Signed   By: Elberta Fortisaniel  Boyle M.D.   On: 05/12/2015 14:54    Assessment & Plan:   There are no diagnoses linked to this encounter. I am having Ms. Bergland maintain her aspirin, fish oil-omega-3 fatty acids, acetaminophen, traMADol, fluconazole, amitriptyline, spironolactone, Vitamin D3, eszopiclone, diltiazem, omeprazole, ALPRAZolam, and escitalopram.  Meds ordered this encounter  Medications  . ALPRAZolam (XANAX) 0.25 MG tablet    Sig: Take 1 tablet (0.25 mg total) by mouth 2 (two) times daily as needed for anxiety or sleep.    Dispense:  60 tablet    Refill:  2  . escitalopram (LEXAPRO) 10 MG tablet    Sig: Take 1 tablet (10 mg total) by mouth daily.    Dispense:  30 tablet    Refill:  5  Follow-up: No Follow-up on file.  Walker Kehr, MD

## 2015-12-29 NOTE — Progress Notes (Signed)
Pre visit review using our clinic review tool, if applicable. No additional management support is needed unless otherwise documented below in the visit note. 

## 2015-12-31 ENCOUNTER — Encounter: Payer: Self-pay | Admitting: Internal Medicine

## 2015-12-31 NOTE — Assessment & Plan Note (Addendum)
Discussed: sick father - colon ca w/mets, frail mother Start Lexapro Psychol ref

## 2015-12-31 NOTE — Assessment & Plan Note (Signed)
Diltazem, Spironolactone 

## 2015-12-31 NOTE — Assessment & Plan Note (Signed)
S/p extensive scoliosis surgery as a teenager (thoracic and LS spine).  Chronic pain Tramadol prn  Potential benefits of a short/long term opioids use as well as potential risks (i.e. addiction risk, apnea etc) and complications (i.e. Somnolence, constipation and others) were explained to the patient and were aknowledged.

## 2016-01-10 ENCOUNTER — Other Ambulatory Visit: Payer: Self-pay

## 2016-01-10 DIAGNOSIS — Z1231 Encounter for screening mammogram for malignant neoplasm of breast: Secondary | ICD-10-CM

## 2016-01-17 ENCOUNTER — Other Ambulatory Visit: Payer: Self-pay

## 2016-01-17 MED ORDER — DILTIAZEM HCL ER COATED BEADS 180 MG PO CP24: ORAL_CAPSULE | ORAL | Status: DC

## 2016-02-05 ENCOUNTER — Ambulatory Visit: Payer: BLUE CROSS/BLUE SHIELD

## 2016-02-16 ENCOUNTER — Ambulatory Visit: Payer: BLUE CROSS/BLUE SHIELD

## 2016-02-24 ENCOUNTER — Other Ambulatory Visit: Payer: Self-pay | Admitting: Internal Medicine

## 2016-02-28 ENCOUNTER — Other Ambulatory Visit: Payer: Self-pay | Admitting: Internal Medicine

## 2016-02-28 NOTE — Telephone Encounter (Signed)
[facility] had to [facility] on [facility]...Raechel Chute/lmb

## 2016-03-01 ENCOUNTER — Ambulatory Visit
Admission: RE | Admit: 2016-03-01 | Discharge: 2016-03-01 | Disposition: A | Discharge status: Home / Self Care | Payer: BLUE CROSS/BLUE SHIELD | Source: Ambulatory Visit

## 2016-03-01 DIAGNOSIS — Z1231 Encounter for screening mammogram for malignant neoplasm of breast: Secondary | ICD-10-CM

## 2016-04-01 ENCOUNTER — Other Ambulatory Visit: Payer: Self-pay | Admitting: *Deleted

## 2016-04-01 MED ORDER — ESCITALOPRAM OXALATE 10 MG PO TABS: 10.0000 mg | ORAL_TABLET | Freq: Every day | ORAL | Status: DC

## 2016-04-08 ENCOUNTER — Other Ambulatory Visit: Payer: Self-pay | Admitting: *Deleted

## 2016-04-08 MED ORDER — ESCITALOPRAM OXALATE 10 MG PO TABS: 10.0000 mg | ORAL_TABLET | Freq: Every day | ORAL | Status: DC

## 2016-04-12 ENCOUNTER — Other Ambulatory Visit: Payer: Self-pay | Admitting: Internal Medicine

## 2016-05-02 ENCOUNTER — Other Ambulatory Visit: Payer: Self-pay | Admitting: *Deleted

## 2016-05-02 NOTE — Telephone Encounter (Signed)
Rf req for Amitriptyline 25 mg. # 180  Last filled 11/21/15. Last OV 12/29/15.  Ok to Rf?

## 2016-05-06 ENCOUNTER — Telehealth: Payer: Self-pay | Admitting: Emergency Medicine

## 2016-05-06 MED ORDER — AMITRIPTYLINE HCL 25 MG PO TABS: 25.0000 mg | ORAL_TABLET | Freq: Every day | ORAL | 3 refills | Status: DC

## 2016-05-06 NOTE — Telephone Encounter (Signed)
Pharmacy called and needs a prescription refill on Amitriptyline. Please follow up thanks.

## 2016-05-06 NOTE — Telephone Encounter (Signed)
Pt called and needs a refill on amitriptyline (ELAVIL) 25 MG tablet. [facility] [address] Please follow up thanks.

## 2016-05-06 NOTE — Telephone Encounter (Signed)
See 05/02/16 refill encounter. Closing phone note.

## 2016-05-13 ENCOUNTER — Other Ambulatory Visit: Payer: Self-pay | Admitting: Internal Medicine

## 2016-05-17 ENCOUNTER — Encounter: Payer: BLUE CROSS/BLUE SHIELD | Admitting: Internal Medicine

## 2016-06-05 ENCOUNTER — Other Ambulatory Visit (INDEPENDENT_AMBULATORY_CARE_PROVIDER_SITE_OTHER): Payer: BLUE CROSS/BLUE SHIELD

## 2016-06-05 ENCOUNTER — Other Ambulatory Visit: Payer: Self-pay | Admitting: Internal Medicine

## 2016-06-05 DIAGNOSIS — Z0000 Encounter for general adult medical examination without abnormal findings: Secondary | ICD-10-CM

## 2016-06-05 LAB — BASIC METABOLIC PANEL
BUN: 10 mg/dL (ref 6–23)
CHLORIDE: 102 meq/L (ref 96–112)
CO2: 29 meq/L (ref 19–32)
Calcium: 9.8 mg/dL (ref 8.4–10.5)
Creatinine, Ser: 0.79 mg/dL (ref 0.40–1.20)
GFR: 100.27 mL/min (ref >60.00)
GLUCOSE: 88 mg/dL (ref 70–99)
POTASSIUM: 3.8 meq/L (ref 3.5–5.1)
SODIUM: 139 meq/L (ref 135–145)

## 2016-06-05 LAB — CBC WITH DIFFERENTIAL/PLATELET
BASOS PCT: 0.4 % (ref 0.0–3.0)
Basophils Absolute: 0 10*3/uL (ref 0.0–0.1)
EOS PCT: 1.8 % (ref 0.0–5.0)
Eosinophils Absolute: 0.1 10*3/uL (ref 0.0–0.7)
HCT: 39.2 % (ref 36.0–46.0)
Hemoglobin: 13 g/dL (ref 12.0–15.0)
LYMPHS ABS: 2.4 10*3/uL (ref 0.7–4.0)
Lymphocytes Relative: 34.7 % (ref 12.0–46.0)
MCHC: 33.2 g/dL (ref 30.0–36.0)
MCV: 84.6 fl (ref 78.0–100.0)
MONOS PCT: 6.4 % (ref 3.0–12.0)
Monocytes Absolute: 0.4 10*3/uL (ref 0.1–1.0)
NEUTROS PCT: 56.7 % (ref 43.0–77.0)
Neutro Abs: 4 10*3/uL (ref 1.4–7.7)
Platelets: 346 10*3/uL (ref 150.0–400.0)
RBC: 4.64 Mil/uL (ref 3.87–5.11)
RDW: 13.8 % (ref 11.5–15.5)
WBC: 7 10*3/uL (ref 4.0–10.5)

## 2016-06-05 LAB — HEPATIC FUNCTION PANEL
ALBUMIN: 4.6 g/dL (ref 3.5–5.2)
ALT: 12 U/L (ref 0–35)
AST: 16 U/L (ref 0–37)
Alkaline Phosphatase: 69 U/L (ref 39–117)
Bilirubin, Direct: 0.1 mg/dL (ref 0.0–0.3)
Total Bilirubin: 0.5 mg/dL (ref 0.2–1.2)
Total Protein: 7.7 g/dL (ref 6.0–8.3)

## 2016-06-05 LAB — URINALYSIS
BILIRUBIN URINE: NEGATIVE
HGB URINE DIPSTICK: NEGATIVE
Ketones, ur: NEGATIVE
LEUKOCYTES UA: NEGATIVE
NITRITE: NEGATIVE
Specific Gravity, Urine: 1.01 (ref 1.000–1.030)
Total Protein, Urine: NEGATIVE
Urine Glucose: NEGATIVE
Urobilinogen, UA: 1 (ref 0.0–1.0)
pH: 7 (ref 5.0–8.0)

## 2016-06-05 LAB — LIPID PANEL
CHOLESTEROL: 236 mg/dL — AB (ref 0–200)
HDL: 86.3 mg/dL (ref >39.00)
LDL Cholesterol: 132 mg/dL — ABNORMAL HIGH (ref 0–99)
NonHDL: 149.87
Total CHOL/HDL Ratio: 3
Triglycerides: 87 mg/dL (ref 0.0–149.0)
VLDL: 17.4 mg/dL (ref 0.0–40.0)

## 2016-06-05 LAB — TSH: TSH: 1.18 u[IU]/mL (ref 0.35–4.50)

## 2016-06-07 ENCOUNTER — Ambulatory Visit (INDEPENDENT_AMBULATORY_CARE_PROVIDER_SITE_OTHER): Payer: BLUE CROSS/BLUE SHIELD | Admitting: Internal Medicine

## 2016-06-07 ENCOUNTER — Encounter: Payer: Self-pay | Admitting: Internal Medicine

## 2016-06-07 DIAGNOSIS — M5440 Lumbago with sciatica, unspecified side: Secondary | ICD-10-CM

## 2016-06-07 DIAGNOSIS — Z0000 Encounter for general adult medical examination without abnormal findings: Secondary | ICD-10-CM | POA: Diagnosis not present

## 2016-06-07 DIAGNOSIS — I10 Essential (primary) hypertension: Secondary | ICD-10-CM

## 2016-06-07 DIAGNOSIS — F4321 Adjustment disorder with depressed mood: Secondary | ICD-10-CM | POA: Diagnosis not present

## 2016-06-07 DIAGNOSIS — K5901 Slow transit constipation: Secondary | ICD-10-CM | POA: Diagnosis not present

## 2016-06-07 DIAGNOSIS — K5900 Constipation, unspecified: Secondary | ICD-10-CM | POA: Insufficient documentation

## 2016-06-07 MED ORDER — SPIRONOLACTONE 25 MG PO TABS: 25.0000 mg | ORAL_TABLET | Freq: Every day | ORAL | 11 refills | Status: DC

## 2016-06-07 MED ORDER — DILTIAZEM HCL ER COATED BEADS 180 MG PO CP24: ORAL_CAPSULE | ORAL | 11 refills | Status: DC

## 2016-06-07 MED ORDER — LINACLOTIDE 290 MCG PO CAPS: 290.0000 ug | ORAL_CAPSULE | Freq: Every day | ORAL | 3 refills | Status: DC | PRN

## 2016-06-07 MED ORDER — ESCITALOPRAM OXALATE 10 MG PO TABS: 10.0000 mg | ORAL_TABLET | Freq: Every day | ORAL | 11 refills | Status: DC

## 2016-06-07 MED ORDER — ALPRAZOLAM 0.25 MG PO TABS: 0.2500 mg | ORAL_TABLET | Freq: Two times a day (BID) | ORAL | 2 refills | Status: DC | PRN

## 2016-06-07 NOTE — Assessment & Plan Note (Signed)
Diltazem, Spironolactone 

## 2016-06-07 NOTE — Progress Notes (Signed)
Pre visit review using our clinic review tool, if applicable. No additional management support is needed unless otherwise documented below in the visit note. 

## 2016-06-07 NOTE — Assessment & Plan Note (Addendum)
We discussed age appropriate health related issues, including available/recomended screening tests and vaccinations. We discussed a need for adhering to healthy diet and exercise. Labs/EKG were reviewed/ordered. All questions were answered. Shots at work

## 2016-06-07 NOTE — Assessment & Plan Note (Signed)
dad died with cancer in 7/17 Lexapro qd

## 2016-06-07 NOTE — Assessment & Plan Note (Signed)
  Start Linzess po Colonoscopy if not well

## 2016-06-07 NOTE — Progress Notes (Signed)
Subjective:  Patient ID: [name], female    DOB: 01/17/1971  Age: 45 y.o. MRN: [id number]  CC: Annual Exam   HPI [name] presents for a well exam Her dad died with cancer in 2023-05-08 - tearful F/u LBP, anxiety C/o constipation - chronic  Outpatient Medications Prior to Visit  Medication Sig Dispense Refill  . ALPRAZolam (XANAX) 0.25 MG tablet Take 1 tablet (0.25 mg total) by mouth 2 (two) times daily as needed for anxiety or sleep. 60 tablet 2  . amitriptyline (ELAVIL) 25 MG tablet Take 1-2 tablets (25-50 mg total) by mouth at bedtime. 180 tablet 3  . aspirin 81 MG tablet Take 81 mg by mouth daily.      . Cholecalciferol (VITAMIN D3) 2000 UNITS capsule Take 1 capsule (2,000 Units total) by mouth daily. 100 capsule 3  . diltiazem (CARDIZEM CD) 180 MG 24 hr capsule TAKE 1 CAPSULE (180 MG TOTAL) BY MOUTH DAILY. 90 capsule 2  . escitalopram (LEXAPRO) 10 MG tablet Take 1 tablet (10 mg total) by mouth daily. Yearly physical due in July must see MD for refills 90 tablet 0  . fish oil-omega-3 fatty acids 1000 MG capsule Take 1 g by mouth daily.    [name] omeprazole (PRILOSEC) 20 MG capsule TAKE 1 CAPSULE (20 MG TOTAL) BY MOUTH DAILY. 30 capsule 7  . spironolactone (ALDACTONE) 25 MG tablet TAKE 1 TABLET (25 MG TOTAL) BY MOUTH DAILY. 30 tablet 11  . eszopiclone (LUNESTA) 1 MG TABS tablet TAKE 3 TABLETS (=3MG ) AT BEDTIME AS NEEDED 90 tablet 1  . acetaminophen (TYLENOL) 500 MG tablet Take 500 mg by mouth daily as needed. For headache    . fluconazole (DIFLUCAN) 150 MG tablet Take 1 tablet (150 mg total) by mouth daily. (Patient not taking: Reported on 06/07/2016) 1 tablet 1  . traMADol (ULTRAM) 50 MG tablet Take 1-2 tablets (50-100 mg total) by mouth 2 (two) times daily as needed for severe pain. (Patient not taking: Reported on 06/07/2016) 60 tablet 3   No facility-administered medications prior to visit.     ROS Review of Systems  Constitutional: Positive for diaphoresis. Negative for  activity change, appetite change, chills, fatigue and unexpected weight change.  HENT: Negative for congestion, mouth sores and sinus pressure.   Eyes: Negative for visual disturbance.  Respiratory: Negative for cough and chest tightness.   Gastrointestinal: Positive for constipation. Negative for abdominal distention, abdominal pain and nausea.  Genitourinary: Negative for difficulty urinating, frequency and vaginal pain.  Musculoskeletal: Negative for back pain and gait problem.  Skin: Negative for pallor and rash.  Neurological: Negative for dizziness, tremors, weakness, numbness and headaches.  Psychiatric/Behavioral: Positive for dysphoric mood and sleep disturbance. Negative for confusion.    Objective:  BP 120/88   Pulse 77   Wt 144 lb (65.3 kg)   SpO2 99%   BMI 22.55 kg/m   BP Readings from Last 3 Encounters:  06/07/16 120/88  12/29/15 120/90  05/12/15 110/80    Wt Readings from Last 3 Encounters:  06/07/16 144 lb (65.3 kg)  12/29/15 142 lb (64.4 kg)  05/12/15 141 lb (64 kg)    Physical Exam  Constitutional: She appears well-developed. No distress.  HENT:  Head: Normocephalic.  Right Ear: External ear normal.  Left Ear: External ear normal.  Nose: Nose normal.  Mouth/Throat: Oropharynx is clear and moist.  Eyes: Conjunctivae are normal. Pupils are equal, round, and reactive to light. Right eye exhibits no discharge. Left eye  exhibits no discharge.  Neck: Normal range of motion. Neck supple. No JVD present. No tracheal deviation present. No thyromegaly present.  Cardiovascular: Normal rate, regular rhythm and normal heart sounds.   Pulmonary/Chest: No stridor. No respiratory distress. She has no wheezes.  Abdominal: Soft. Bowel sounds are normal. She exhibits distension. She exhibits no mass. There is no tenderness. There is no rebound and no guarding.  Musculoskeletal: She exhibits no edema or tenderness.  Lymphadenopathy:    She has no cervical adenopathy.    Neurological: She displays normal reflexes. No cranial nerve deficit. She exhibits normal muscle tone. Coordination normal.  Skin: No rash noted. No erythema.  Psychiatric: Her behavior is normal. Judgment and thought content normal.  tearful   Lab Results  Component Value Date   WBC 7.0 06/05/2016   HGB 13.0 06/05/2016   HCT 39.2 06/05/2016   PLT 346.0 06/05/2016   GLUCOSE 88 06/05/2016   CHOL 236 (H) 06/05/2016   TRIG 87.0 06/05/2016   HDL 86.30 06/05/2016   LDLDIRECT 120.8 08/04/2013   LDLCALC 132 (H) 06/05/2016   ALT 12 06/05/2016   AST 16 06/05/2016   NA 139 06/05/2016   K 3.8 06/05/2016   CL 102 06/05/2016   CREATININE 0.79 06/05/2016   BUN 10 06/05/2016   CO2 29 06/05/2016   TSH 1.18 06/05/2016    Mm Digital Screening Bilateral  Result Date: 03/04/2016 CLINICAL DATA:  Screening. EXAM: DIGITAL SCREENING BILATERAL MAMMOGRAM WITH CAD COMPARISON:  Previous exam(s). ACR Breast Density Category c: The breast tissue is heterogeneously dense, which may obscure small masses. FINDINGS: There are no findings suspicious for malignancy. Images were processed with CAD. IMPRESSION: No mammographic evidence of malignancy. A result letter of this screening mammogram will be mailed directly to the patient. RECOMMENDATION: Screening mammogram in one year. (Code:SM-B-01Y) BI-RADS CATEGORY  1: Negative. Electronically Signed   By: Rolla Plateandolph  Jackson M.D.   On: 03/04/2016 10:04    Assessment & Plan:   There are no diagnoses linked to this encounter. I am having [name] maintain her aspirin, fish oil-omega-3 fatty acids, acetaminophen, traMADol, fluconazole, Vitamin D3, omeprazole, ALPRAZolam, diltiazem, escitalopram, amitriptyline, spironolactone, and Eszopiclone.  Meds ordered this encounter  Medications  . Eszopiclone 3 MG TABS    Sig: Take 3 mg by mouth at bedtime as needed.    Refill:  1     Follow-up: No Follow-up on file.  Sonda PrimesAlex Plotnikov, MD

## 2016-07-10 ENCOUNTER — Other Ambulatory Visit: Payer: Self-pay | Admitting: Internal Medicine

## 2016-07-11 NOTE — Telephone Encounter (Signed)
Done

## 2016-09-02 ENCOUNTER — Telehealth: Payer: Self-pay | Admitting: Emergency Medicine

## 2016-09-02 DIAGNOSIS — M5441 Lumbago with sciatica, right side: Principal | ICD-10-CM

## 2016-09-02 DIAGNOSIS — G8929 Other chronic pain: Secondary | ICD-10-CM

## 2016-09-02 DIAGNOSIS — M5442 Lumbago with sciatica, left side: Principal | ICD-10-CM

## 2016-09-02 NOTE — Telephone Encounter (Signed)
Pt called and asked if she can get a referral to an orthopedic. She has arthritis in her back. Please advise thanks.

## 2016-09-03 NOTE — Telephone Encounter (Signed)
OK. Thx

## 2016-12-13 ENCOUNTER — Ambulatory Visit: Payer: BLUE CROSS/BLUE SHIELD | Admitting: Internal Medicine

## 2017-01-14 ENCOUNTER — Other Ambulatory Visit: Payer: Self-pay | Admitting: Internal Medicine

## 2017-01-20 NOTE — Telephone Encounter (Signed)
Rec'd call pt checking status on her alprazolam pharmacy sent request on last Wed...[name]

## 2017-01-21 NOTE — Telephone Encounter (Signed)
Notified [facility].../lmb 

## 2017-02-22 ENCOUNTER — Other Ambulatory Visit: Payer: Self-pay | Admitting: Internal Medicine

## 2017-02-25 NOTE — Telephone Encounter (Signed)
Pt left msg on triage requesting status on refill for her Lunesta. Is this ok to refill...Raechel Chute/lmb

## 2017-02-25 NOTE — Telephone Encounter (Signed)
OK to fill this/these prescription(s) with additional refills x1 Needs to have an OV every 6 months - pls sch Thank you!

## 2017-02-25 NOTE — Telephone Encounter (Signed)
sch OV 

## 2017-02-26 MED ORDER — ESZOPICLONE 3 MG PO TABS: 3.0000 mg | ORAL_TABLET | Freq: Every evening | ORAL | 1 refills | Status: DC | PRN

## 2017-02-26 NOTE — Addendum Note (Signed)
Addended by: Deatra JamesBRAND, Marcia Hartwell M on: 02/26/2017 09:21 AM   Modules accepted: Orders

## 2017-02-26 NOTE — Telephone Encounter (Signed)
[facility], also called pt no answer LMOM w/MD response...Raechel Chute/lmb

## 2017-05-16 ENCOUNTER — Other Ambulatory Visit: Payer: 59

## 2017-05-16 ENCOUNTER — Ambulatory Visit (INDEPENDENT_AMBULATORY_CARE_PROVIDER_SITE_OTHER): Payer: 59 | Admitting: Internal Medicine

## 2017-05-16 ENCOUNTER — Encounter: Payer: Self-pay | Admitting: Internal Medicine

## 2017-05-16 VITALS — BP 122/82 | HR 76 | Temp 97.7°F | Ht 67.0 in | Wt 154.0 lb

## 2017-05-16 DIAGNOSIS — K219 Gastro-esophageal reflux disease without esophagitis: Secondary | ICD-10-CM

## 2017-05-16 DIAGNOSIS — F4321 Adjustment disorder with depressed mood: Secondary | ICD-10-CM

## 2017-05-16 DIAGNOSIS — I10 Essential (primary) hypertension: Secondary | ICD-10-CM | POA: Diagnosis not present

## 2017-05-16 DIAGNOSIS — F419 Anxiety disorder, unspecified: Secondary | ICD-10-CM

## 2017-05-16 DIAGNOSIS — Z0000 Encounter for general adult medical examination without abnormal findings: Secondary | ICD-10-CM | POA: Diagnosis not present

## 2017-05-16 DIAGNOSIS — F4320 Adjustment disorder, unspecified: Secondary | ICD-10-CM | POA: Diagnosis not present

## 2017-05-16 MED ORDER — SPIRONOLACTONE 25 MG PO TABS: 25.0000 mg | ORAL_TABLET | Freq: Every day | ORAL | 11 refills | Status: DC

## 2017-05-16 MED ORDER — OMEPRAZOLE 20 MG PO CPDR: DELAYED_RELEASE_CAPSULE | ORAL | 11 refills | Status: DC

## 2017-05-16 MED ORDER — DILTIAZEM HCL ER COATED BEADS 180 MG PO CP24: ORAL_CAPSULE | ORAL | 11 refills | Status: DC

## 2017-05-16 MED ORDER — ESCITALOPRAM OXALATE 10 MG PO TABS: 10.0000 mg | ORAL_TABLET | Freq: Every day | ORAL | 11 refills | Status: DC

## 2017-05-16 MED ORDER — ESZOPICLONE 3 MG PO TABS: 3.0000 mg | ORAL_TABLET | Freq: Every evening | ORAL | 3 refills | Status: DC | PRN

## 2017-05-16 MED ORDER — ALPRAZOLAM 0.5 MG PO TBDP: 0.5000 mg | ORAL_TABLET | Freq: Two times a day (BID) | ORAL | 3 refills | Status: DC | PRN

## 2017-05-16 NOTE — Assessment & Plan Note (Signed)
On Prilosec

## 2017-05-16 NOTE — Progress Notes (Signed)
Subjective:  Patient ID: [name], female    DOB: 09/07/1971  Age: 46 y.o. MRN: [id number]  CC: No chief complaint on file.   HPI [name] presents for a well exam F/u  anxiety, LBP, HTN  Outpatient Medications Prior to Visit  Medication Sig Dispense Refill  . ALPRAZolam (XANAX) 0.25 MG tablet TAKE 1 TABLET BY MOUTH TWICE A DAY AS NEEDED FOR ANXIETY/SLEEP 60 tablet 1  . amitriptyline (ELAVIL) 25 MG tablet Take 1-2 tablets (25-50 mg total) by mouth at bedtime. 180 tablet 3  . aspirin 81 MG tablet Take 81 mg by mouth daily.      . Cholecalciferol (VITAMIN D3) 2000 UNITS capsule Take 1 capsule (2,000 Units total) by mouth daily. 100 capsule 3  . diltiazem (CARDIZEM CD) 180 MG 24 hr capsule TAKE 1 CAPSULE (180 MG TOTAL) BY MOUTH DAILY. 30 capsule 11  . escitalopram (LEXAPRO) 10 MG tablet Take 1 tablet (10 mg total) by mouth daily. 30 tablet 11  . Eszopiclone 3 MG TABS Take 1 tablet (3 mg total) by mouth at bedtime as needed. Take immediately before bedtime 30 tablet 1  . fish oil-omega-3 fatty acids 1000 MG capsule Take 1 g by mouth daily.    . fluconazole (DIFLUCAN) 150 MG tablet Take 1 tablet (150 mg total) by mouth daily. 1 tablet 1  . omeprazole (PRILOSEC) 20 MG capsule TAKE 1 CAPSULE (20 MG TOTAL) BY MOUTH DAILY. 30 capsule 7  . spironolactone (ALDACTONE) 25 MG tablet Take 1 tablet (25 mg total) by mouth daily. 30 tablet 11  . acetaminophen (TYLENOL) 500 MG tablet Take 500 mg by mouth daily as needed. For headache    . linaclotide (LINZESS) 290 MCG CAPS capsule Take 1 capsule (290 mcg total) by mouth daily as needed. 90 capsule 3  . traMADol (ULTRAM) 50 MG tablet Take 1-2 tablets (50-100 mg total) by mouth 2 (two) times daily as needed for severe pain. (Patient not taking: Reported on 06/07/2016) 60 tablet 3   No facility-administered medications prior to visit.     ROS Review of Systems  Constitutional: Negative for activity change, appetite change, chills, fatigue and  unexpected weight change.  HENT: Negative for congestion, mouth sores and sinus pressure.   Eyes: Negative for visual disturbance.  Respiratory: Negative for cough and chest tightness.   Gastrointestinal: Negative for abdominal pain and nausea.  Genitourinary: Negative for difficulty urinating, frequency and vaginal pain.  Musculoskeletal: Negative for back pain and gait problem.  Skin: Negative for pallor and rash.  Neurological: Negative for dizziness, tremors, weakness, numbness and headaches.  Psychiatric/Behavioral: Negative for confusion and sleep disturbance. The patient is nervous/anxious.     Objective:  BP 122/82 (BP Location: Left Arm, Patient Position: Sitting, Cuff Size: Normal)   Pulse 76   Temp 97.7 F (36.5 C) (Oral)   Ht 5\' 7"  (1.702 m)   Wt 154 lb (69.9 kg)   SpO2 99%   BMI 24.12 kg/m   BP Readings from Last 3 Encounters:  05/16/17 122/82  06/07/16 120/88  12/29/15 120/90    Wt Readings from Last 3 Encounters:  05/16/17 154 lb (69.9 kg)  06/07/16 144 lb (65.3 kg)  12/29/15 142 lb (64.4 kg)    Physical Exam  Constitutional: She appears well-developed. No distress.  HENT:  Head: Normocephalic.  Right Ear: External ear normal.  Left Ear: External ear normal.  Nose: Nose normal.  Mouth/Throat: Oropharynx is clear and moist.  Eyes: Pupils are  equal, round, and reactive to light. Conjunctivae are normal. Right eye exhibits no discharge. Left eye exhibits no discharge.  Neck: Normal range of motion. Neck supple. No JVD present. No tracheal deviation present. No thyromegaly present.  Cardiovascular: Normal rate, regular rhythm and normal heart sounds.   Pulmonary/Chest: No stridor. No respiratory distress. She has no wheezes.  Abdominal: Soft. Bowel sounds are normal. She exhibits no distension and no mass. There is no tenderness. There is no rebound and no guarding.  Musculoskeletal: She exhibits tenderness. She exhibits no edema.  Lymphadenopathy:    She  has no cervical adenopathy.  Neurological: She displays normal reflexes. No cranial nerve deficit. She exhibits normal muscle tone. Coordination normal.  Skin: No rash noted. No erythema.  Psychiatric: She has a normal mood and affect. Her behavior is normal. Judgment and thought content normal.    Lab Results  Component Value Date   WBC 7.0 06/05/2016   HGB 13.0 06/05/2016   HCT 39.2 06/05/2016   PLT 346.0 06/05/2016   GLUCOSE 88 06/05/2016   CHOL 236 (H) 06/05/2016   TRIG 87.0 06/05/2016   HDL 86.30 06/05/2016   LDLDIRECT 120.8 08/04/2013   LDLCALC 132 (H) 06/05/2016   ALT 12 06/05/2016   AST 16 06/05/2016   NA 139 06/05/2016   K 3.8 06/05/2016   CL 102 06/05/2016   CREATININE 0.79 06/05/2016   BUN 10 06/05/2016   CO2 29 06/05/2016   TSH 1.18 06/05/2016    Mm Digital Screening Bilateral  Result Date: 03/01/2016 CLINICAL DATA:  Screening. EXAM: DIGITAL SCREENING BILATERAL MAMMOGRAM WITH CAD COMPARISON:  Previous exam(s). ACR Breast Density Category c: The breast tissue is heterogeneously dense, which may obscure small masses. FINDINGS: There are no findings suspicious for malignancy. Images were processed with CAD. IMPRESSION: No mammographic evidence of malignancy. A result letter of this screening mammogram will be mailed directly to the patient. RECOMMENDATION: Screening mammogram in one year. (Code:SM-B-01Y) BI-RADS CATEGORY  1: Negative. Electronically Signed   By: Rolla Plateandolph  Jackson M.D.   On: 03/04/2016 10:04    Assessment & Plan:   There are no diagnoses linked to this encounter. I have discontinued [name]'s acetaminophen, traMADol, and linaclotide. I am also having her maintain her aspirin, fish oil-omega-3 fatty acids, fluconazole, Vitamin D3, omeprazole, amitriptyline, escitalopram, diltiazem, spironolactone, ALPRAZolam, and Eszopiclone.  No orders of the defined types were placed in this encounter.    Follow-up: No Follow-up on file.  Sonda PrimesAlex Plotnikov, MD

## 2017-05-16 NOTE — Assessment & Plan Note (Signed)
We discussed age appropriate health related issues, including available/recomended screening tests and vaccinations. We discussed a need for adhering to healthy diet and exercise. Labs were ordered to be later reviewed . All questions were answered.  tDAP next year

## 2017-05-16 NOTE — Assessment & Plan Note (Signed)
Diltazem, Spironolactone

## 2017-05-16 NOTE — Assessment & Plan Note (Signed)
Discussed.

## 2017-05-16 NOTE — Assessment & Plan Note (Signed)
Worse Xanax dose was increased   Potential benefits of a long term benzodiazepines  use as well as potential risks  and complications were explained to the patient and were aknowledged.

## 2017-05-17 LAB — CBC WITH DIFFERENTIAL/PLATELET
BASOS: 0 %
Basophils Absolute: 0 10*3/uL (ref 0.0–0.2)
EOS (ABSOLUTE): 0.1 10*3/uL (ref 0.0–0.4)
EOS: 1 %
HEMATOCRIT: 36.8 % (ref 34.0–46.6)
Hemoglobin: 12.8 g/dL (ref 11.1–15.9)
IMMATURE GRANS (ABS): 0 10*3/uL (ref 0.0–0.1)
Immature Granulocytes: 0 %
LYMPHS: 37 %
Lymphocytes Absolute: 2.5 10*3/uL (ref 0.7–3.1)
MCH: 28.1 pg (ref 26.6–33.0)
MCHC: 34.8 g/dL (ref 31.5–35.7)
MCV: 81 fL (ref 79–97)
Monocytes Absolute: 0.4 10*3/uL (ref 0.1–0.9)
Monocytes: 7 %
NEUTROS ABS: 3.7 10*3/uL (ref 1.4–7.0)
Neutrophils: 55 %
PLATELETS: 332 10*3/uL (ref 150–379)
RBC: 4.55 x10E6/uL (ref 3.77–5.28)
RDW: 14.5 % (ref 12.3–15.4)
WBC: 6.6 10*3/uL (ref 3.4–10.8)

## 2017-05-17 LAB — URINALYSIS, ROUTINE W REFLEX MICROSCOPIC
Bilirubin, UA: NEGATIVE
GLUCOSE, UA: NEGATIVE
KETONES UA: NEGATIVE
LEUKOCYTES UA: NEGATIVE
NITRITE UA: NEGATIVE
PROTEIN UA: NEGATIVE
RBC, UA: NEGATIVE
SPEC GRAV UA: 1.026 (ref 1.005–1.030)
Urobilinogen, Ur: 1 mg/dL (ref 0.2–1.0)
pH, UA: 6.5 (ref 5.0–7.5)

## 2017-05-17 LAB — HEPATIC FUNCTION PANEL
ALK PHOS: 79 IU/L (ref 39–117)
ALT: 11 IU/L (ref 0–32)
AST: 18 IU/L (ref 0–40)
Albumin: 4.8 g/dL (ref 3.5–5.5)
BILIRUBIN, DIRECT: 0.07 mg/dL (ref 0.00–0.40)
Bilirubin Total: 0.2 mg/dL (ref 0.0–1.2)
Total Protein: 7.5 g/dL (ref 6.0–8.5)

## 2017-05-17 LAB — BASIC METABOLIC PANEL
BUN/Creatinine Ratio: 17 (ref 9–23)
BUN: 13 mg/dL (ref 6–24)
CALCIUM: 10.2 mg/dL (ref 8.7–10.2)
CO2: 26 mmol/L (ref 20–29)
Chloride: 102 mmol/L (ref 96–106)
Creatinine, Ser: 0.76 mg/dL (ref 0.57–1.00)
GFR, EST AFRICAN AMERICAN: 107 mL/min/{1.73_m2} (ref >59)
GFR, EST NON AFRICAN AMERICAN: 93 mL/min/{1.73_m2} (ref >59)
Glucose: 92 mg/dL (ref 65–99)
POTASSIUM: 4.1 mmol/L (ref 3.5–5.2)
SODIUM: 140 mmol/L (ref 134–144)

## 2017-05-17 LAB — LIPID PANEL
CHOL/HDL RATIO: 2.8 ratio (ref 0.0–4.4)
Cholesterol, Total: 222 mg/dL — ABNORMAL HIGH (ref 100–199)
HDL: 78 mg/dL (ref >39)
LDL CALC: 129 mg/dL — AB (ref 0–99)
Triglycerides: 74 mg/dL (ref 0–149)
VLDL Cholesterol Cal: 15 mg/dL (ref 5–40)

## 2017-05-17 LAB — TSH: TSH: 0.998 u[IU]/mL (ref 0.450–4.500)

## 2017-05-17 LAB — VITAMIN D 25 HYDROXY (VIT D DEFICIENCY, FRACTURES): Vit D, 25-Hydroxy: 20.1 ng/mL — ABNORMAL LOW (ref 30.0–100.0)

## 2017-05-18 MED ORDER — VITAMIN D3 1.25 MG (50000 UT) PO CAPS: 1.0000 | ORAL_CAPSULE | ORAL | 0 refills | Status: DC

## 2017-05-21 ENCOUNTER — Telehealth: Payer: Self-pay | Admitting: Internal Medicine

## 2017-05-21 MED ORDER — SPIRONOLACTONE 25 MG PO TABS: 25.0000 mg | ORAL_TABLET | Freq: Every day | ORAL | 3 refills | Status: DC

## 2017-05-21 MED ORDER — OMEPRAZOLE 20 MG PO CPDR: DELAYED_RELEASE_CAPSULE | ORAL | 3 refills | Status: DC

## 2017-05-21 MED ORDER — AMITRIPTYLINE HCL 25 MG PO TABS: 25.0000 mg | ORAL_TABLET | Freq: Every day | ORAL | 3 refills | Status: DC

## 2017-05-21 MED ORDER — DILTIAZEM HCL ER COATED BEADS 180 MG PO CP24: ORAL_CAPSULE | ORAL | 3 refills | Status: DC

## 2017-05-21 MED ORDER — ESCITALOPRAM OXALATE 10 MG PO TABS: 10.0000 mg | ORAL_TABLET | Freq: Every day | ORAL | 3 refills | Status: DC

## 2017-05-21 NOTE — Telephone Encounter (Signed)
Called pt no answer LMOM [facility]...Raechel Chute/lmb

## 2017-05-21 NOTE — Telephone Encounter (Signed)
Pt called stating her job is requiring her to use mail order for her prescriptions,  She did not know all of the meds that needs to go to [facility] [phone]  she gave an order number of [id number]  Believes the meds  Lexapro, Cardizem and Spironolactone

## 2017-06-15 ENCOUNTER — Other Ambulatory Visit: Payer: Self-pay | Admitting: Internal Medicine

## 2017-07-11 ENCOUNTER — Other Ambulatory Visit: Payer: Self-pay | Admitting: Internal Medicine

## 2017-07-15 ENCOUNTER — Other Ambulatory Visit: Payer: Self-pay | Admitting: Obstetrics and Gynecology

## 2017-07-15 DIAGNOSIS — Z1231 Encounter for screening mammogram for malignant neoplasm of breast: Secondary | ICD-10-CM

## 2017-07-31 ENCOUNTER — Ambulatory Visit: Payer: 59

## 2017-08-09 ENCOUNTER — Other Ambulatory Visit: Payer: Self-pay | Admitting: Internal Medicine

## 2017-09-09 ENCOUNTER — Ambulatory Visit
Admission: RE | Admit: 2017-09-09 | Discharge: 2017-09-09 | Disposition: A | Discharge status: Home / Self Care | Payer: 59 | Source: Ambulatory Visit | Attending: Obstetrics and Gynecology | Admitting: Obstetrics and Gynecology

## 2017-09-09 DIAGNOSIS — Z1231 Encounter for screening mammogram for malignant neoplasm of breast: Secondary | ICD-10-CM

## 2017-09-10 ENCOUNTER — Other Ambulatory Visit: Payer: Self-pay | Admitting: Internal Medicine

## 2017-09-20 ENCOUNTER — Other Ambulatory Visit: Payer: Self-pay | Admitting: Internal Medicine

## 2017-10-04 ENCOUNTER — Other Ambulatory Visit: Payer: Self-pay

## 2017-10-04 ENCOUNTER — Emergency Department (HOSPITAL_COMMUNITY)
Admission: EM | Admit: 2017-10-04 | Discharge: 2017-10-04 | Disposition: A | Discharge status: Home / Self Care | Payer: 59 | Attending: Emergency Medicine | Admitting: Emergency Medicine

## 2017-10-04 ENCOUNTER — Emergency Department (HOSPITAL_COMMUNITY): Payer: 59

## 2017-10-04 ENCOUNTER — Encounter (HOSPITAL_COMMUNITY): Payer: Self-pay | Admitting: Emergency Medicine

## 2017-10-04 DIAGNOSIS — Z79899 Other long term (current) drug therapy: Secondary | ICD-10-CM | POA: Diagnosis not present

## 2017-10-04 DIAGNOSIS — M7661 Achilles tendinitis, right leg: Secondary | ICD-10-CM | POA: Diagnosis not present

## 2017-10-04 DIAGNOSIS — I10 Essential (primary) hypertension: Secondary | ICD-10-CM | POA: Diagnosis not present

## 2017-10-04 DIAGNOSIS — Z7982 Long term (current) use of aspirin: Secondary | ICD-10-CM | POA: Insufficient documentation

## 2017-10-04 DIAGNOSIS — M25571 Pain in right ankle and joints of right foot: Secondary | ICD-10-CM | POA: Diagnosis present

## 2017-10-04 MED ORDER — NAPROXEN 500 MG PO TABS: 500.0000 mg | ORAL_TABLET | Freq: Two times a day (BID) | ORAL | 0 refills | Status: DC

## 2017-10-04 NOTE — ED Triage Notes (Signed)
Pt. Stated, Im having right ankle pain for 2 days, No injury

## 2017-10-04 NOTE — ED Provider Notes (Signed)
[facility] EMERGENCY DEPARTMENT Provider Note   CSN: [id number] Arrival date & time: 10/04/17  4098     History   Chief Complaint Chief Complaint  Patient presents with  . Ankle Pain    HPI [name] is a 46 y.o. female.  The history is provided by the patient. No language interpreter was used.  Ankle Pain      [name] is a 46 y.o. female who presents to the Emergency Department complaining of right ankle pain.  She reports 2 days of nontraumatic right ankle pain.  The pain is located on the sides and posterior aspect of her ankle.  Her pain is constant in nature but worse when she takes off her shoes or when she bears weight to the right leg.  There is no foot pain, knee pain, leg pain.  No known injuries.  She denies any fevers, chills, chest pain, shortness of breath, abdominal pain, nausea, vomiting.  She does have a history of hypertension, no additional medical problems.  Past Medical History:  Diagnosis Date  . Allergic rhinitis   . Anemia    NOS  . Chicken pox   . Chicken pox   . Dysmenorrhea   . Dysuria   . GERD (gastroesophageal reflux disease)   . Headache(784.0)   . HTN (hypertension)   . Hx of migraines   . Menorrhagia   . Missed abortion    x 2  . SVT (supraventricular tachycardia) (HCC)    h/o r/t stress, no problems  . Termination of pregnancy    x 1    Patient Active Problem List   Diagnosis Date Noted  . Anxiety 05/16/2017  . Constipation 06/07/2016  . Grief 06/07/2016  . Back pain 02/22/2015  . Stress 07/10/2014  . Abdominal pain, epigastric 08/04/2013  . Abdominal pain, other specified site 08/04/2013  . Insomnia 06/25/2013  . S/P endometrial ablation 12/23/2011  . Dysuria 12/23/2011  . Well adult exam 11/29/2011  . Hypokalemia 11/29/2011  . ANEMIA-NOS 12/17/2010  . SINUSITIS, ACUTE 06/05/2009  . Seasonal and perennial allergic rhinitis 01/20/2008  . MIGRAINE HEADACHE 08/05/2007  . HYPERTENSION,  CONTROLLED 08/05/2007  . GERD 08/05/2007  . 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 ADENOIDECTOMY  1978  . APPENDECTOMY  1992  . BACK SURGERY     rod in back r/t scolosis  . DILATION AND CURETTAGE OF UTERUS    . HERNIA REPAIR  08/2006   umbilical  . hysterocospy    . SVD     x 2  . TONSILLECTOMY  1978  . UPPER GASTROINTESTINAL ENDOSCOPY      OB History    No data available       Home Medications    Prior to Admission medications   Medication Sig Start Date End Date Taking? Authorizing Provider  ALPRAZolam (NIRAVAM) 0.5 MG dissolvable tablet Take 1 tablet (0.5 mg total) by mouth 2 (two) times daily as needed for anxiety. 05/16/17   Plotnikov, Georgina Quint, MD  amitriptyline (ELAVIL) 25 MG tablet Take 1-2 tablets (25-50 mg total) by mouth at bedtime. 05/21/17   Plotnikov, Georgina Quint, MD  aspirin 81 MG tablet Take 81 mg by mouth daily.      [provider]  Cholecalciferol (VITAMIN D3) 2000 UNITS capsule Take 1 capsule (2,000 Units total) by mouth daily. 05/12/15   Plotnikov, Georgina Quint, MD  Cholecalciferol (VITAMIN D3) 50000  units CAPS TAKE ONE CAPSULE BY MOUTH ONE TIME PER WEEK 09/10/17   Plotnikov, Georgina QuintAleksei V, MD  diltiazem (CARDIZEM CD) 180 MG 24 hr capsule TAKE 1 CAPSULE (180 MG TOTAL) BY MOUTH DAILY. 05/21/17   Plotnikov, Georgina QuintAleksei V, MD  escitalopram (LEXAPRO) 10 MG tablet Take 1 tablet (10 mg total) by mouth daily. 05/21/17   Plotnikov, Georgina QuintAleksei V, MD  Eszopiclone 3 MG TABS TAKE 1 TABLET BY MOUTH IMMEDIATELY BEFORE BEDTIME AS NEEDED 09/22/17   Plotnikov, Georgina QuintAleksei V, MD  fish oil-omega-3 fatty acids 1000 MG capsule Take 1 g by mouth daily.    [provider]  fluconazole (DIFLUCAN) 150 MG tablet Take 1 tablet (150 mg total) by mouth daily. 02/10/15   Plotnikov, Georgina QuintAleksei V, MD  naproxen (NAPROSYN) 500 MG tablet Take 1 tablet (500 mg total) by mouth 2 (two) times daily with a meal. 10/04/17   Tilden Fossaees, Evalyse Stroope, MD    omeprazole (PRILOSEC) 20 MG capsule TAKE 1 CAPSULE (20 MG TOTAL) BY MOUTH DAILY. 05/21/17   Plotnikov, Georgina QuintAleksei V, MD  spironolactone (ALDACTONE) 25 MG tablet Take 1 tablet (25 mg total) by mouth daily. 05/21/17   Plotnikov, Georgina QuintAleksei V, MD    Family History Family History  Problem Relation Age of Onset  . Stroke Mother   . Hypertension Mother   . Diabetes Mother   . Stroke Father   . Hypertension Father   . Cancer Father 1566       colon ca w/liver mets  . Heart disease Father        defibrilator    Social History Social History   Tobacco Use  . Smoking status: Never Smoker  . Smokeless tobacco: Never Used  Substance Use Topics  . Alcohol use: Yes    Comment: socially  . Drug use: No     Allergies   Oxycodone   Review of Systems Review of Systems  All other systems reviewed and are negative.    Physical Exam Updated Vital Signs BP 110/80 (BP Location: Right Arm)   Pulse 72   Temp 98.5 F (36.9 C) (Oral)   Resp 16   Ht 5\' 7"  (1.702 m)   Wt 67.6 kg (149 lb)   SpO2 99%   BMI 23.34 kg/m   Physical Exam  Constitutional: She is oriented to person, place, and time. She appears well-developed and well-nourished. No distress.  HENT:  Head: Normocephalic and atraumatic.  Cardiovascular: Normal rate and regular rhythm.  Pulmonary/Chest: Effort normal. No respiratory distress.  Musculoskeletal: Normal range of motion.  2+ DP pulses bilaterally.  There is tenderness to palpation over the Achilles tendon and posterior ankle.  There is no discrete bony tenderness to palpation throughout the malleoli, calcaneus.  No midfoot tenderness to palpation.  There is minimal swelling to the posterior dorsiflexion and plantarflexion are intact.  There is no tenderness throughout the calf or knee.  No erythema or rash.  Neurological: She is alert and oriented to person, place, and time.  Skin: Skin is warm and dry. Capillary refill takes less than 2 seconds.  Psychiatric: She has a  normal mood and affect. Her behavior is normal.  Nursing note and vitals reviewed.    ED Treatments / Results  Labs (all labs ordered are listed, but only abnormal results are displayed) Labs Reviewed - No data to display  EKG  EKG Interpretation None       Radiology Dg Ankle Complete Right  Result Date: 10/04/2017 CLINICAL DATA:  Pt is having  right ankle pain, posterior aspect of ankle is where the majority of her pain is located with some pain on the medial and lateral sides. PT denies any injury to ankle, states it began hurting 2 days ago. EXAM: RIGHT ANKLE - COMPLETE 3+ VIEW COMPARISON:  None. FINDINGS: No fracture.  No bone lesion. The ankle joint is normally spaced and aligned. No arthropathic change. There is some ossification in the distal Achilles tendon just above its insertion. Soft tissues are otherwise unremarkable. IMPRESSION: 1. No fracture, bone lesion or ankle joint abnormality. 2. Ossification in the distal Achilles tendon. Consider Achilles tendon pathology if this correlates with the site of pain. Electronically Signed   By: Amie Portlandavid  Ormond M.D.   On: 10/04/2017 12:19    Procedures Procedures (including critical care time)  Medications Ordered in ED Medications - No data to display   Initial Impression / Assessment and Plan / ED Course  I have reviewed the triage vital signs and the nursing notes.  Pertinent labs & imaging results that were available during my care of the patient were reviewed by me and considered in my medical decision making (see chart for details).     In here for evaluation of atraumatic right ankle pain.  She does have tenderness over the Achilles tendon with no evidence of tendon disruption.  There is no evidence of acute infectious process at this time.  No evidence of fracture or dislocation.  Discussed with patient likely tendinitis.  Discussed treatment with rest and NSAIDs with outpatient follow-up and return precautions.  Final  Clinical Impressions(s) / ED Diagnoses   Final diagnoses:  Achilles tendinitis of right lower extremity    ED Discharge Orders        Ordered    naproxen (NAPROSYN) 500 MG tablet  2 times daily with meals     10/04/17 1240       Tilden Fossaees, Tiwan Schnitker, MD 10/04/17 [phone]

## 2017-10-04 NOTE — ED Notes (Signed)
Pt verbalized understanding of d/c instructions and has no further questions. Splint applied to right ankle and given relief. VSS. NAD. Pt removed all belongings.

## 2017-10-19 ENCOUNTER — Other Ambulatory Visit: Payer: Self-pay | Admitting: Internal Medicine

## 2017-11-24 ENCOUNTER — Other Ambulatory Visit: Payer: Self-pay | Admitting: Internal Medicine

## 2017-11-25 NOTE — Telephone Encounter (Signed)
Routing to dr plotnikov, please advise, thanks 

## 2017-11-28 ENCOUNTER — Other Ambulatory Visit: Payer: Self-pay | Admitting: Internal Medicine

## 2017-11-28 NOTE — Telephone Encounter (Signed)
Routing to dr plotnikov, please advise, thanks 

## 2018-01-04 ENCOUNTER — Other Ambulatory Visit: Payer: Self-pay | Admitting: Internal Medicine

## 2018-01-05 NOTE — Telephone Encounter (Signed)
Ov q 6 mo

## 2018-02-23 ENCOUNTER — Other Ambulatory Visit: Payer: Self-pay | Admitting: Internal Medicine

## 2018-05-26 ENCOUNTER — Other Ambulatory Visit: Payer: Managed Care, Other (non HMO)

## 2018-05-26 ENCOUNTER — Encounter: Payer: Self-pay | Admitting: Internal Medicine

## 2018-05-26 ENCOUNTER — Ambulatory Visit (INDEPENDENT_AMBULATORY_CARE_PROVIDER_SITE_OTHER): Payer: Managed Care, Other (non HMO) | Admitting: Internal Medicine

## 2018-05-26 VITALS — BP 118/80 | HR 88 | Temp 98.9°F | Ht 67.0 in | Wt 145.0 lb

## 2018-05-26 DIAGNOSIS — K5901 Slow transit constipation: Secondary | ICD-10-CM

## 2018-05-26 DIAGNOSIS — F4321 Adjustment disorder with depressed mood: Secondary | ICD-10-CM

## 2018-05-26 DIAGNOSIS — Z0000 Encounter for general adult medical examination without abnormal findings: Secondary | ICD-10-CM

## 2018-05-26 DIAGNOSIS — F419 Anxiety disorder, unspecified: Secondary | ICD-10-CM | POA: Diagnosis not present

## 2018-05-26 MED ORDER — DILTIAZEM HCL ER COATED BEADS 180 MG PO CP24
ORAL_CAPSULE | ORAL | 3 refills | Status: DC
Start: 2018-05-26 — End: 2019-05-10

## 2018-05-26 MED ORDER — VITAMIN D3 50 MCG (2000 UT) PO CAPS: 2000.0000 [IU] | ORAL_CAPSULE | Freq: Every day | ORAL | 3 refills | Status: DC

## 2018-05-26 MED ORDER — ESCITALOPRAM OXALATE 20 MG PO TABS: 20.0000 mg | ORAL_TABLET | Freq: Every day | ORAL | 5 refills | Status: DC

## 2018-05-26 MED ORDER — SPIRONOLACTONE 25 MG PO TABS
25.0000 mg | ORAL_TABLET | Freq: Every day | ORAL | 3 refills | Status: DC
Start: 2018-05-26 — End: 2018-07-21

## 2018-05-26 MED ORDER — LINACLOTIDE 290 MCG PO CAPS: 290.0000 ug | ORAL_CAPSULE | Freq: Every day | ORAL | 3 refills | Status: DC

## 2018-05-26 MED ORDER — OMEPRAZOLE 20 MG PO CPDR: DELAYED_RELEASE_CAPSULE | ORAL | 3 refills | Status: DC

## 2018-05-26 MED ORDER — ALPRAZOLAM 0.5 MG PO TABS: 0.5000 mg | ORAL_TABLET | Freq: Two times a day (BID) | ORAL | 2 refills | Status: DC | PRN

## 2018-05-26 MED ORDER — ESZOPICLONE 3 MG PO TABS: 3.0000 mg | ORAL_TABLET | Freq: Every evening | ORAL | 3 refills | Status: DC | PRN

## 2018-05-26 NOTE — Assessment & Plan Note (Signed)
Father passed on 2 years ago - grieving

## 2018-05-26 NOTE — Assessment & Plan Note (Signed)
Father passed on 2 years ago - grieving 

## 2018-05-26 NOTE — Progress Notes (Signed)
Subjective:  Patient ID: [name], female    DOB: 09/03/1971  Age: 47 y.o. MRN: [id number]  CC: No chief complaint on file.   HPI [name] presents for a well exam  C/o constipation  Outpatient Medications Prior to Visit  Medication Sig Dispense Refill  . ALPRAZolam (NIRAVAM) 0.5 MG dissolvable tablet DISSOLVE 1 TABLET ON THE TONGUE 2 TIMES DAILY AS NEEDED FOR ANXIETY 60 tablet 1  . amitriptyline (ELAVIL) 25 MG tablet Take 1-2 tablets (25-50 mg total) by mouth at bedtime. 180 tablet 3  . aspirin 81 MG tablet Take 81 mg by mouth daily.      . Cholecalciferol (VITAMIN D3) 2000 UNITS capsule Take 1 capsule (2,000 Units total) by mouth daily. 100 capsule 3  . D3-50 50000 units capsule TAKE ONE CAPSULE BY MOUTH ONE TIME PER WEEK 8 capsule 3  . diltiazem (CARDIZEM CD) 180 MG 24 hr capsule TAKE 1 CAPSULE (180 MG TOTAL) BY MOUTH DAILY. 90 capsule 3  . escitalopram (LEXAPRO) 10 MG tablet Take 1 tablet (10 mg total) by mouth daily. 90 tablet 3  . Eszopiclone 3 MG TABS TAKE 1 TABLET BY MOUTH EVERY EVENING AT BEDTIME AS NEEDED 30 tablet 3  . fish oil-omega-3 fatty acids 1000 MG capsule Take 1 g by mouth daily.    . fluconazole (DIFLUCAN) 150 MG tablet Take 1 tablet (150 mg total) by mouth daily. 1 tablet 1  . naproxen (NAPROSYN) 500 MG tablet Take 1 tablet (500 mg total) by mouth 2 (two) times daily with a meal. 14 tablet 0  . omeprazole (PRILOSEC) 20 MG capsule TAKE 1 CAPSULE (20 MG TOTAL) BY MOUTH DAILY. 90 capsule 3  . spironolactone (ALDACTONE) 25 MG tablet Take 1 tablet (25 mg total) by mouth daily. 90 tablet 3   No facility-administered medications prior to visit.     ROS: Review of Systems  Constitutional: Negative for activity change, appetite change, chills, fatigue and unexpected weight change.  HENT: Negative for congestion, mouth sores and sinus pressure.   Eyes: Negative for visual disturbance.  Respiratory: Negative for cough and chest tightness.     Gastrointestinal: Positive for constipation. Negative for abdominal pain and nausea.  Genitourinary: Negative for difficulty urinating, frequency and vaginal pain.  Musculoskeletal: Negative for back pain and gait problem.  Skin: Negative for pallor and rash.  Neurological: Negative for dizziness, tremors, weakness, numbness and headaches.  Psychiatric/Behavioral: Negative for confusion, sleep disturbance and suicidal ideas.    Objective:  BP 118/80 (BP Location: Left Arm, Patient Position: Sitting, Cuff Size: Normal)   Pulse 88   Temp 98.9 F (37.2 C) (Oral)   Ht 5\' 7"  (1.702 m)   Wt 145 lb (65.8 kg)   SpO2 97%   BMI 22.71 kg/m   BP Readings from Last 3 Encounters:  05/26/18 118/80  10/04/17 110/80  05/16/17 122/82    Wt Readings from Last 3 Encounters:  05/26/18 145 lb (65.8 kg)  10/04/17 149 lb (67.6 kg)  05/16/17 154 lb (69.9 kg)    Physical Exam  Constitutional: She appears well-developed. No distress.  HENT:  Head: Normocephalic.  Right Ear: External ear normal.  Left Ear: External ear normal.  Nose: Nose normal.  Mouth/Throat: Oropharynx is clear and moist.  Eyes: Pupils are equal, round, and reactive to light. Conjunctivae are normal. Right eye exhibits no discharge. Left eye exhibits no discharge.  Neck: Normal range of motion. Neck supple. No JVD present. No tracheal deviation present. No  thyromegaly present.  Cardiovascular: Normal rate, regular rhythm and normal heart sounds.  Pulmonary/Chest: No stridor. No respiratory distress. She has no wheezes.  Abdominal: Soft. Bowel sounds are normal. She exhibits no distension and no mass. There is no tenderness. There is no rebound and no guarding.  Musculoskeletal: She exhibits no edema or tenderness.  Lymphadenopathy:    She has no cervical adenopathy.  Neurological: She displays normal reflexes. No cranial nerve deficit. She exhibits normal muscle tone. Coordination normal.  Skin: No rash noted. No erythema.   Psychiatric: She has a normal mood and affect. Her behavior is normal. Judgment and thought content normal.    Lab Results  Component Value Date   WBC 6.6 05/16/2017   HGB 12.8 05/16/2017   HCT 36.8 05/16/2017   PLT 332 05/16/2017   GLUCOSE 92 05/16/2017   CHOL 222 (H) 05/16/2017   TRIG 74 05/16/2017   HDL 78 05/16/2017   LDLDIRECT 120.8 08/04/2013   LDLCALC 129 (H) 05/16/2017   ALT 11 05/16/2017   AST 18 05/16/2017   NA 140 05/16/2017   K 4.1 05/16/2017   CL 102 05/16/2017   CREATININE 0.76 05/16/2017   BUN 13 05/16/2017   CO2 26 05/16/2017   TSH 0.998 05/16/2017    Dg Ankle Complete Right  Result Date: 10/04/2017 CLINICAL DATA:  Pt is having right ankle pain, posterior aspect of ankle is where the majority of her pain is located with some pain on the medial and lateral sides. PT denies any injury to ankle, states it began hurting 2 days ago. EXAM: RIGHT ANKLE - COMPLETE 3+ VIEW COMPARISON:  None. FINDINGS: No fracture.  No bone lesion. The ankle joint is normally spaced and aligned. No arthropathic change. There is some ossification in the distal Achilles tendon just above its insertion. Soft tissues are otherwise unremarkable. IMPRESSION: 1. No fracture, bone lesion or ankle joint abnormality. 2. Ossification in the distal Achilles tendon. Consider Achilles tendon pathology if this correlates with the site of pain. Electronically Signed   By: Amie Portlandavid  Ormond M.D.   On: 10/04/2017 12:19    Assessment & Plan:   There are no diagnoses linked to this encounter.   No orders of the defined types were placed in this encounter.    Follow-up: No follow-ups on file.  Sonda PrimesAlex Plotnikov, MD

## 2018-05-26 NOTE — Assessment & Plan Note (Signed)
Linzess prn 

## 2018-05-26 NOTE — Assessment & Plan Note (Signed)
We discussed age appropriate health related issues, including available/recomended screening tests and vaccinations. We discussed a need for adhering to healthy diet and exercise. Labs were ordered to be later reviewed . All questions were answered.   

## 2018-05-27 LAB — SPECIMEN STATUS

## 2018-05-28 LAB — SPECIMEN STATUS REPORT

## 2018-05-30 LAB — HEPATIC FUNCTION PANEL
ALK PHOS: 77 IU/L (ref 39–117)
ALT: 9 IU/L (ref 0–32)
AST: 16 IU/L (ref 0–40)
Albumin: 4.7 g/dL (ref 3.5–5.5)
BILIRUBIN TOTAL: 0.2 mg/dL (ref 0.0–1.2)
Bilirubin, Direct: 0.07 mg/dL (ref 0.00–0.40)
TOTAL PROTEIN: 7 g/dL (ref 6.0–8.5)

## 2018-05-30 LAB — URINALYSIS
Bilirubin, UA: NEGATIVE
Glucose, UA: NEGATIVE
Ketones, UA: NEGATIVE
LEUKOCYTES UA: NEGATIVE
NITRITE UA: NEGATIVE
PH UA: 7.5 (ref 5.0–7.5)
Protein, UA: NEGATIVE
RBC, UA: NEGATIVE
Specific Gravity, UA: 1.007 (ref 1.005–1.030)
Urobilinogen, Ur: 0.2 mg/dL (ref 0.2–1.0)

## 2018-05-30 LAB — CBC WITH DIFFERENTIAL/PLATELET
BASOS: 0 %
Basophils Absolute: 0 10*3/uL (ref 0.0–0.2)
EOS (ABSOLUTE): 0.1 10*3/uL (ref 0.0–0.4)
Eos: 1 %
Hematocrit: 39.7 % (ref 34.0–46.6)
Hemoglobin: 12.9 g/dL (ref 11.1–15.9)
IMMATURE GRANS (ABS): 0 10*3/uL (ref 0.0–0.1)
Immature Granulocytes: 0 %
LYMPHS: 39 %
Lymphocytes Absolute: 2.6 10*3/uL (ref 0.7–3.1)
MCH: 27.7 pg (ref 26.6–33.0)
MCHC: 32.5 g/dL (ref 31.5–35.7)
MCV: 85 fL (ref 79–97)
MONOCYTES: 5 %
Monocytes Absolute: 0.3 10*3/uL (ref 0.1–0.9)
NEUTROS ABS: 3.6 10*3/uL (ref 1.4–7.0)
Neutrophils: 55 %
PLATELETS: 314 10*3/uL (ref 150–450)
RBC: 4.65 x10E6/uL (ref 3.77–5.28)
RDW: 14.4 % (ref 12.3–15.4)
WBC: 6.7 10*3/uL (ref 3.4–10.8)

## 2018-05-30 LAB — LIPID PANEL
CHOL/HDL RATIO: 2.7 ratio (ref 0.0–4.4)
Cholesterol, Total: 218 mg/dL — ABNORMAL HIGH (ref 100–199)
HDL: 81 mg/dL (ref >39)
LDL Calculated: 120 mg/dL — ABNORMAL HIGH (ref 0–99)
TRIGLYCERIDES: 83 mg/dL (ref 0–149)
VLDL Cholesterol Cal: 17 mg/dL (ref 5–40)

## 2018-05-30 LAB — BASIC METABOLIC PANEL
BUN/Creatinine Ratio: 9 (ref 9–23)
BUN: 7 mg/dL (ref 6–24)
CALCIUM: 10.2 mg/dL (ref 8.7–10.2)
CHLORIDE: 104 mmol/L (ref 96–106)
CO2: 24 mmol/L (ref 20–29)
CREATININE: 0.78 mg/dL (ref 0.57–1.00)
GFR calc non Af Amer: 90 mL/min/{1.73_m2} (ref >59)
GFR, EST AFRICAN AMERICAN: 103 mL/min/{1.73_m2} (ref >59)
GLUCOSE: 90 mg/dL (ref 65–99)
Potassium: 4.3 mmol/L (ref 3.5–5.2)
Sodium: 143 mmol/L (ref 134–144)

## 2018-05-30 LAB — TSH: TSH: 1.2 u[IU]/mL (ref 0.450–4.500)

## 2018-06-13 ENCOUNTER — Other Ambulatory Visit: Payer: Self-pay | Admitting: Internal Medicine

## 2018-07-02 ENCOUNTER — Other Ambulatory Visit: Payer: Self-pay

## 2018-07-02 NOTE — Telephone Encounter (Signed)
[facility]

## 2018-07-06 ENCOUNTER — Telehealth: Payer: Self-pay | Admitting: Internal Medicine

## 2018-07-06 MED ORDER — ESCITALOPRAM OXALATE 20 MG PO TABS: 20.0000 mg | ORAL_TABLET | Freq: Every day | ORAL | 1 refills | Status: DC

## 2018-07-06 NOTE — Telephone Encounter (Signed)
error 

## 2018-07-09 ENCOUNTER — Telehealth: Payer: Self-pay | Admitting: Internal Medicine

## 2018-07-09 ENCOUNTER — Other Ambulatory Visit: Payer: Self-pay

## 2018-07-09 MED ORDER — LINACLOTIDE 290 MCG PO CAPS: 290.0000 ug | ORAL_CAPSULE | Freq: Every day | ORAL | 3 refills | Status: DC

## 2018-07-09 NOTE — Telephone Encounter (Signed)
Copied from CRM [id number]. Topic: Quick Communication - Rx Refill/Question >> Jul 09, 2018 10:02 AM Crist Infante wrote: Medication: linaclotide (LINZESS) 290 MCG CAPS capsule  Pt knows this Rx has been sent, but would like samples of this. Pt states the dr gave her samples last time she was in. Ok to leave message on VM if you have so she can pick up tomorrow

## 2018-07-10 NOTE — Telephone Encounter (Signed)
Samples placed up front, pt notified.  

## 2018-07-21 ENCOUNTER — Other Ambulatory Visit: Payer: Self-pay | Admitting: Internal Medicine

## 2018-07-30 ENCOUNTER — Telehealth: Payer: Self-pay

## 2018-07-30 NOTE — Telephone Encounter (Signed)
Key: [id number]

## 2018-08-04 DIAGNOSIS — M7660 Achilles tendinitis, unspecified leg: Secondary | ICD-10-CM | POA: Insufficient documentation

## 2018-08-07 ENCOUNTER — Other Ambulatory Visit: Payer: Self-pay | Admitting: Obstetrics and Gynecology

## 2018-08-07 DIAGNOSIS — Z1231 Encounter for screening mammogram for malignant neoplasm of breast: Secondary | ICD-10-CM

## 2018-08-14 NOTE — Telephone Encounter (Signed)
PA approved through 07/31/19

## 2018-09-18 ENCOUNTER — Ambulatory Visit: Payer: Managed Care, Other (non HMO)

## 2018-09-21 ENCOUNTER — Other Ambulatory Visit: Payer: Self-pay | Admitting: Internal Medicine

## 2018-09-21 NOTE — Telephone Encounter (Signed)
Copied from CRM [id number]. Topic: Quick Communication - Rx Refill/Question >> Sep 21, 2018  8:30 AM [name], Rosey Batheresa D wrote: Medication: ALPRAZolam Prudy Feeler(XANAX) 0.5 MG tablet  Has the patient contacted their pharmacy? Yes, pt is still waiting on medication to be send to pharmacy. (Agent: If no, request that the patient contact the pharmacy for the refill.) (Agent: If yes, when and what did the pharmacy advise?)  Preferred Pharmacy (with phone number or street name): [id number]- [place], [place] - [address]  Agent: Please be advised that RX refills may take up to 3 business days. We ask that you follow-up with your pharmacy.

## 2018-09-29 MED ORDER — ALPRAZOLAM 1 MG PO TABS: 0.5000 mg | ORAL_TABLET | Freq: Two times a day (BID) | ORAL | 2 refills | Status: DC | PRN

## 2018-10-15 ENCOUNTER — Other Ambulatory Visit: Payer: Self-pay | Admitting: Internal Medicine

## 2018-10-23 ENCOUNTER — Ambulatory Visit
Admission: RE | Admit: 2018-10-23 | Discharge: 2018-10-23 | Disposition: A | Discharge status: Home / Self Care | Payer: Managed Care, Other (non HMO) | Source: Ambulatory Visit | Attending: Obstetrics and Gynecology | Admitting: Obstetrics and Gynecology

## 2018-10-23 DIAGNOSIS — Z1231 Encounter for screening mammogram for malignant neoplasm of breast: Secondary | ICD-10-CM

## 2018-11-14 ENCOUNTER — Other Ambulatory Visit: Payer: Self-pay | Admitting: Internal Medicine

## 2018-11-17 ENCOUNTER — Telehealth: Payer: Self-pay | Admitting: Internal Medicine

## 2018-11-19 NOTE — Addendum Note (Signed)
Addended by: Scarlett Presto on: 11/19/2018 08:55 AM   Modules accepted: Orders

## 2018-11-20 NOTE — Telephone Encounter (Signed)
RX called in as RX failed to send again

## 2018-11-20 NOTE — Telephone Encounter (Signed)
Patient is calling to check on the status of her medication refill. Can it be resent please? Thank you.

## 2019-04-08 ENCOUNTER — Other Ambulatory Visit: Payer: Self-pay | Admitting: Internal Medicine

## 2019-04-13 ENCOUNTER — Ambulatory Visit (INDEPENDENT_AMBULATORY_CARE_PROVIDER_SITE_OTHER): Payer: Managed Care, Other (non HMO) | Admitting: Internal Medicine

## 2019-04-13 ENCOUNTER — Encounter: Payer: Self-pay | Admitting: Internal Medicine

## 2019-04-13 DIAGNOSIS — F5102 Adjustment insomnia: Secondary | ICD-10-CM | POA: Diagnosis not present

## 2019-04-13 DIAGNOSIS — F32A Depression, unspecified: Secondary | ICD-10-CM | POA: Insufficient documentation

## 2019-04-13 DIAGNOSIS — K5901 Slow transit constipation: Secondary | ICD-10-CM

## 2019-04-13 DIAGNOSIS — F4321 Adjustment disorder with depressed mood: Secondary | ICD-10-CM | POA: Diagnosis not present

## 2019-04-13 DIAGNOSIS — F329 Major depressive disorder, single episode, unspecified: Secondary | ICD-10-CM | POA: Insufficient documentation

## 2019-04-13 DIAGNOSIS — G8929 Other chronic pain: Secondary | ICD-10-CM

## 2019-04-13 DIAGNOSIS — M5440 Lumbago with sciatica, unspecified side: Secondary | ICD-10-CM

## 2019-04-13 MED ORDER — ESCITALOPRAM OXALATE 20 MG PO TABS: 20.0000 mg | ORAL_TABLET | Freq: Every day | ORAL | 3 refills | Status: DC

## 2019-04-13 MED ORDER — ESZOPICLONE 3 MG PO TABS: 3.0000 mg | ORAL_TABLET | Freq: Two times a day (BID) | ORAL | 1 refills | Status: DC | PRN

## 2019-04-13 MED ORDER — LINACLOTIDE 290 MCG PO CAPS: 290.0000 ug | ORAL_CAPSULE | Freq: Every day | ORAL | 3 refills | Status: DC

## 2019-04-13 NOTE — Assessment & Plan Note (Signed)
Tramadol prn  Potential benefits of a short/long term opioids use as well as potential risks (i.e. addiction risk, apnea etc) and complications (i.e. Somnolence, constipation and others) were explained to the patient and were aknowledged.

## 2019-04-13 NOTE — Assessment & Plan Note (Signed)
lunesta prn  Potential benefits of a long term benzodiazepines  use as well as potential risks  and complications were explained to the patient and were aknowledged. 

## 2019-04-13 NOTE — Assessment & Plan Note (Signed)
Re-start Lexapro 

## 2019-04-13 NOTE — Progress Notes (Signed)
Virtual Visit via Video Note  I connected with [name] on 04/13/19 at  9:30 AM EDT by a video enabled telemedicine application and verified that I am speaking with the correct person using two identifiers.   I discussed the limitations of evaluation and management by telemedicine and the availability of in person appointments. The patient expressed understanding and agreed to proceed.  History of Present Illness: We need to follow-up on depression, anxiety, insomnia. Feeling sad - 3d year anniversary of her dad's death. Ran out of her meds some time ago  There has been no runny nose, cough, chest pain, shortness of breath, abdominal pain, diarrhea, constipation, skin rashes. Depressed, not suicidal...   Observations/Objective: The patient appears to be in no acute distress, looks sad.  Assessment and Plan:  See my Assessment and Plan. Follow Up Instructions:    I discussed the assessment and treatment plan with the patient. The patient was provided an opportunity to ask questions and all were answered. The patient agreed with the plan and demonstrated an understanding of the instructions.   The patient was advised to call back or seek an in-person evaluation if the symptoms worsen or if the condition fails to improve as anticipated.  I provided face-to-face time during this encounter. We were at different locations.   Walker Kehr, MD

## 2019-04-13 NOTE — Assessment & Plan Note (Signed)
Discussed.

## 2019-04-13 NOTE — Assessment & Plan Note (Signed)
Linzess 

## 2019-04-20 ENCOUNTER — Telehealth: Payer: Self-pay

## 2019-04-20 MED ORDER — ESZOPICLONE 3 MG PO TABS: 3.0000 mg | ORAL_TABLET | Freq: Every evening | ORAL | 1 refills | Status: DC | PRN

## 2019-04-20 NOTE — Telephone Encounter (Signed)
[facility], eszopiclone 3mg  tabs was written for 1 tablet (3 mg total) by mouth 3 times/day as needed-between meals & bedtime. Take immediately before bedtime. Pharmacy states max daily dose is 1 tablet. Please advise.

## 2019-04-20 NOTE — Telephone Encounter (Signed)
It was definitely an error.  I corrected.  Thanks

## 2019-05-09 ENCOUNTER — Other Ambulatory Visit: Payer: Self-pay | Admitting: Internal Medicine

## 2019-05-22 ENCOUNTER — Other Ambulatory Visit: Payer: Self-pay | Admitting: Internal Medicine

## 2019-05-24 NOTE — Telephone Encounter (Signed)
[place] Controlled Database Checked Last filled: 04/12/19 # 30 LOV w/PCP: 04/13/19 Next appt w/PCP: None  Please advise in PCP's absence. Thanks!

## 2019-07-13 ENCOUNTER — Other Ambulatory Visit: Payer: Self-pay | Admitting: Internal Medicine

## 2019-07-16 ENCOUNTER — Other Ambulatory Visit: Payer: Self-pay | Admitting: Internal Medicine

## 2019-07-16 NOTE — Telephone Encounter (Signed)
[place] Controlled Database Checked Last filled: 05/24/19 #30 LOV w/you: 04/13/19 Next appt w/you: None

## 2019-08-29 ENCOUNTER — Other Ambulatory Visit: Payer: Self-pay | Admitting: Internal Medicine

## 2019-09-01 ENCOUNTER — Other Ambulatory Visit: Payer: Self-pay | Admitting: Internal Medicine

## 2019-09-16 ENCOUNTER — Encounter: Payer: Self-pay | Admitting: Internal Medicine

## 2019-09-16 ENCOUNTER — Ambulatory Visit (INDEPENDENT_AMBULATORY_CARE_PROVIDER_SITE_OTHER): Payer: Managed Care, Other (non HMO) | Admitting: Internal Medicine

## 2019-09-16 ENCOUNTER — Other Ambulatory Visit (INDEPENDENT_AMBULATORY_CARE_PROVIDER_SITE_OTHER): Payer: Managed Care, Other (non HMO)

## 2019-09-16 ENCOUNTER — Other Ambulatory Visit: Payer: Self-pay

## 2019-09-16 DIAGNOSIS — F5102 Adjustment insomnia: Secondary | ICD-10-CM

## 2019-09-16 DIAGNOSIS — Z0000 Encounter for general adult medical examination without abnormal findings: Secondary | ICD-10-CM

## 2019-09-16 DIAGNOSIS — M5440 Lumbago with sciatica, unspecified side: Secondary | ICD-10-CM

## 2019-09-16 DIAGNOSIS — F419 Anxiety disorder, unspecified: Secondary | ICD-10-CM | POA: Diagnosis not present

## 2019-09-16 DIAGNOSIS — G8929 Other chronic pain: Secondary | ICD-10-CM

## 2019-09-16 DIAGNOSIS — K219 Gastro-esophageal reflux disease without esophagitis: Secondary | ICD-10-CM | POA: Diagnosis not present

## 2019-09-16 LAB — CBC WITH DIFFERENTIAL/PLATELET
Basophils Absolute: 0 10*3/uL (ref 0.0–0.1)
Basophils Relative: 0.4 % (ref 0.0–3.0)
Eosinophils Absolute: 0.1 10*3/uL (ref 0.0–0.7)
Eosinophils Relative: 1.3 % (ref 0.0–5.0)
HCT: 38.2 % (ref 36.0–46.0)
Hemoglobin: 12.5 g/dL (ref 12.0–15.0)
Lymphocytes Relative: 25.8 % (ref 12.0–46.0)
Lymphs Abs: 1.6 10*3/uL (ref 0.7–4.0)
MCHC: 32.7 g/dL (ref 30.0–36.0)
MCV: 87 fl (ref 78.0–100.0)
Monocytes Absolute: 0.5 10*3/uL (ref 0.1–1.0)
Monocytes Relative: 8.3 % (ref 3.0–12.0)
Neutro Abs: 4.1 10*3/uL (ref 1.4–7.7)
Neutrophils Relative %: 64.2 % (ref 43.0–77.0)
Platelets: 338 10*3/uL (ref 150.0–400.0)
RBC: 4.39 Mil/uL (ref 3.87–5.11)
RDW: 14.3 % (ref 11.5–15.5)
WBC: 6.3 10*3/uL (ref 4.0–10.5)

## 2019-09-16 LAB — BASIC METABOLIC PANEL
BUN: 9 mg/dL (ref 6–23)
CO2: 29 mEq/L (ref 19–32)
Calcium: 9.7 mg/dL (ref 8.4–10.5)
Chloride: 104 mEq/L (ref 96–112)
Creatinine, Ser: 0.71 mg/dL (ref 0.40–1.20)
GFR: 105.26 mL/min (ref >60.00)
Glucose, Bld: 94 mg/dL (ref 70–99)
Potassium: 4 mEq/L (ref 3.5–5.1)
Sodium: 140 mEq/L (ref 135–145)

## 2019-09-16 LAB — URINALYSIS
Bilirubin Urine: NEGATIVE
Hgb urine dipstick: NEGATIVE
Ketones, ur: NEGATIVE
Leukocytes,Ua: NEGATIVE
Nitrite: NEGATIVE
Specific Gravity, Urine: 1.015 (ref 1.000–1.030)
Total Protein, Urine: NEGATIVE
Urine Glucose: NEGATIVE
Urobilinogen, UA: 0.2 (ref 0.0–1.0)
pH: 7 (ref 5.0–8.0)

## 2019-09-16 LAB — LIPID PANEL
Cholesterol: 216 mg/dL — ABNORMAL HIGH (ref 0–200)
HDL: 80.9 mg/dL (ref >39.00)
LDL Cholesterol: 121 mg/dL — ABNORMAL HIGH (ref 0–99)
NonHDL: 134.89
Total CHOL/HDL Ratio: 3
Triglycerides: 70 mg/dL (ref 0.0–149.0)
VLDL: 14 mg/dL (ref 0.0–40.0)

## 2019-09-16 LAB — HEPATIC FUNCTION PANEL
ALT: 25 U/L (ref 0–35)
AST: 25 U/L (ref 0–37)
Albumin: 4.2 g/dL (ref 3.5–5.2)
Alkaline Phosphatase: 95 U/L (ref 39–117)
Bilirubin, Direct: 0 mg/dL (ref 0.0–0.3)
Total Bilirubin: 0.4 mg/dL (ref 0.2–1.2)
Total Protein: 7 g/dL (ref 6.0–8.3)

## 2019-09-16 LAB — TSH: TSH: 1.04 u[IU]/mL (ref 0.35–4.50)

## 2019-09-16 MED ORDER — SPIRONOLACTONE 25 MG PO TABS: 25.0000 mg | ORAL_TABLET | Freq: Every day | ORAL | 3 refills | Status: DC

## 2019-09-16 MED ORDER — ESZOPICLONE 3 MG PO TABS: 3.0000 mg | ORAL_TABLET | Freq: Every evening | ORAL | 1 refills | Status: DC | PRN

## 2019-09-16 MED ORDER — DILTIAZEM HCL ER COATED BEADS 180 MG PO CP24: ORAL_CAPSULE | ORAL | 3 refills | Status: DC

## 2019-09-16 MED ORDER — ALPRAZOLAM 1 MG PO TABS: ORAL_TABLET | ORAL | 1 refills | Status: DC

## 2019-09-16 MED ORDER — LINACLOTIDE 290 MCG PO CAPS: 290.0000 ug | ORAL_CAPSULE | Freq: Every day | ORAL | 3 refills | Status: DC

## 2019-09-16 MED ORDER — ESCITALOPRAM OXALATE 20 MG PO TABS: 20.0000 mg | ORAL_TABLET | Freq: Every day | ORAL | 3 refills | Status: DC

## 2019-09-16 MED ORDER — AMITRIPTYLINE HCL 25 MG PO TABS: 25.0000 mg | ORAL_TABLET | Freq: Every day | ORAL | 3 refills | Status: DC

## 2019-09-16 MED ORDER — OMEPRAZOLE 20 MG PO CPDR: DELAYED_RELEASE_CAPSULE | ORAL | 3 refills | Status: DC

## 2019-09-16 NOTE — Progress Notes (Signed)
Subjective:  Patient ID: [name], female    DOB: [birth year]  Age: 48 y.o. MRN: [id number]  CC: No chief complaint on file.   HPI Evangelyn I Mcnelly presents for neck pain on the left, LBP, anxiety, HTN Pt fell on the stairs - R hip. F/u GERD, anxiety Well exam   Outpatient Medications Prior to Visit  Medication Sig Dispense Refill  . ALPRAZolam (XANAX) 1 MG tablet TAKE 1/2 TABLET BY MOUTH TWICE A DAY AS NEEDED FOR ANXIETY DONT TAKE WITH LUNEST. NEED APPT 30 tablet 1  . amitriptyline (ELAVIL) 25 MG tablet Take 1-2 tablets (25-50 mg total) by mouth at bedtime. 180 tablet 3  . aspirin 81 MG tablet Take 81 mg by mouth daily.      . [facility] D3 50 MCG (2000 UT) CAPS TAKE ONE CAPSULE BY MOUTH EVERY DAY 100 capsule 3  . D3-50 50000 units capsule TAKE ONE CAPSULE BY MOUTH ONE TIME PER WEEK 8 capsule 3  . diltiazem (CARDIZEM CD) 180 MG 24 hr capsule TAKE 1 CAPSULE BY MOUTH  DAILY 90 capsule 3  . escitalopram (LEXAPRO) 20 MG tablet TAKE 1 TABLET BY MOUTH EVERY DAY 30 tablet 1  . Eszopiclone 3 MG TABS Take 1 tablet (3 mg total) by mouth at bedtime as needed. Take immediately before bedtime 90 tablet 1  . fish oil-omega-3 fatty acids 1000 MG capsule Take 1 g by mouth daily.    . fluconazole (DIFLUCAN) 150 MG tablet Take 1 tablet (150 mg total) by mouth daily. 1 tablet 1  . linaclotide (LINZESS) 290 MCG CAPS capsule Take 1 capsule (290 mcg total) by mouth daily before breakfast. 90 capsule 3  . omeprazole (PRILOSEC) 20 MG capsule TAKE 1 CAPSULE BY MOUTH  DAILY 90 capsule 3  . spironolactone (ALDACTONE) 25 MG tablet TAKE 1 TABLET BY MOUTH  DAILY 90 tablet 3   No facility-administered medications prior to visit.     ROS: Review of Systems  Constitutional: Positive for fatigue. Negative for activity change, appetite change, chills and unexpected weight change.  HENT: Negative for congestion, mouth sores and sinus pressure.   Eyes: Negative for visual disturbance.  Respiratory: Negative for cough  and chest tightness.   Gastrointestinal: Positive for constipation. Negative for abdominal pain and nausea.  Genitourinary: Negative for difficulty urinating, frequency and vaginal pain.  Musculoskeletal: Positive for arthralgias, back pain, gait problem, neck pain and neck stiffness.  Skin: Negative for pallor and rash.  Neurological: Negative for dizziness, tremors, weakness, numbness and headaches.  Psychiatric/Behavioral: Negative for confusion, sleep disturbance and suicidal ideas. The patient is nervous/anxious.     Objective:  BP 126/82 (BP Location: Left Arm, Patient Position: Sitting, Cuff Size: Normal)   Pulse 76   Temp 98.4 F (36.9 C) (Oral)   Ht 5\' 7"  (1.702 m)   Wt 149 lb (67.6 kg)   SpO2 95%   BMI 23.34 kg/m   BP Readings from Last 3 Encounters:  09/16/19 126/82  05/26/18 118/80  10/04/17 110/80    Wt Readings from Last 3 Encounters:  09/16/19 149 lb (67.6 kg)  05/26/18 145 lb (65.8 kg)  10/04/17 149 lb (67.6 kg)    Physical Exam Constitutional:      General: She is not in acute distress.    Appearance: She is well-developed.  HENT:     Head: Normocephalic.     Right Ear: External ear normal.     Left Ear: External ear normal.  Nose: Nose normal.  Eyes:     General:        Right eye: No discharge.        Left eye: No discharge.     Conjunctiva/sclera: Conjunctivae normal.     Pupils: Pupils are equal, round, and reactive to light.  Neck:     Musculoskeletal: Normal range of motion and neck supple.     Thyroid: No thyromegaly.     Vascular: No JVD.     Trachea: No tracheal deviation.  Cardiovascular:     Rate and Rhythm: Normal rate and regular rhythm.     Heart sounds: Normal heart sounds.  Pulmonary:     Effort: No respiratory distress.     Breath sounds: No stridor. No wheezing.  Abdominal:     General: Bowel sounds are normal. There is no distension.     Palpations: Abdomen is soft. There is no mass.     Tenderness: There is no  abdominal tenderness. There is no guarding or rebound.  Musculoskeletal:        General: Tenderness present.  Lymphadenopathy:     Cervical: No cervical adenopathy.  Skin:    Findings: No erythema or rash.  Neurological:     Mental Status: She is oriented to person, place, and time.     Cranial Nerves: No cranial nerve deficit.     Motor: No abnormal muscle tone.     Coordination: Coordination normal.     Deep Tendon Reflexes: Reflexes normal.  Psychiatric:        Behavior: Behavior normal.        Thought Content: Thought content normal.        Judgment: Judgment normal.    Neck tender Scoliosis Lab Results  Component Value Date   WBC 6.7 05/26/2018   HGB 12.9 05/26/2018   HCT 39.7 05/26/2018   PLT 314 05/26/2018   GLUCOSE 90 05/26/2018   CHOL 218 (H) 05/26/2018   TRIG 83 05/26/2018   HDL 81 05/26/2018   LDLDIRECT 120.8 08/04/2013   LDLCALC 120 (H) 05/26/2018   ALT 9 05/26/2018   AST 16 05/26/2018   NA 143 05/26/2018   K 4.3 05/26/2018   CL 104 05/26/2018   CREATININE 0.78 05/26/2018   BUN 7 05/26/2018   CO2 24 05/26/2018   TSH 1.200 05/26/2018    Mm Digital Screening Bilateral  Result Date: 10/26/2018 CLINICAL DATA:  Screening. EXAM: DIGITAL SCREENING BILATERAL MAMMOGRAM WITH CAD COMPARISON:  Previous exam(s). ACR Breast Density Category c: The breast tissue is heterogeneously dense, which may obscure small masses. FINDINGS: There are no findings suspicious for malignancy. Images were processed with CAD. IMPRESSION: No mammographic evidence of malignancy. A result letter of this screening mammogram will be mailed directly to the patient. RECOMMENDATION: Screening mammogram in one year. (Code:SM-B-01Y) BI-RADS CATEGORY  1: Negative. Electronically Signed   By: Amie Portland M.D.   On: 10/26/2018 07:59    Assessment & Plan:   There are no diagnoses linked to this encounter.   No orders of the defined types were placed in this encounter.    Follow-up: No  follow-ups on file.  Sonda Primes, MD

## 2019-09-16 NOTE — Assessment & Plan Note (Signed)
Tramadol prn  Potential benefits of a short/long term opioids use as well as potential risks (i.e. addiction risk, apnea etc) and complications (i.e. Somnolence, constipation and others) were explained to the patient and were aknowledged.

## 2019-09-16 NOTE — Assessment & Plan Note (Signed)
Xanax prn  Potential benefits of a long term benzodiazepines  use as well as potential risks  and complications were explained to the patient and were aknowledged. 

## 2019-09-16 NOTE — Assessment & Plan Note (Signed)
On Prilosec ?Potential benefits of a long term PPI use as well as potential risks  and complications were explained to the patient and were aknowledged. ?

## 2019-09-16 NOTE — Assessment & Plan Note (Signed)
lunesta prn  Potential benefits of a long term benzodiazepines  use as well as potential risks  and complications were explained to the patient and were aknowledged. 

## 2019-09-16 NOTE — Assessment & Plan Note (Signed)
We discussed age appropriate health related issues, including available/recomended screening tests and vaccinations. We discussed a need for adhering to healthy diet and exercise. Labs were ordered to be later reviewed . All questions were answered.  Colon - pt said next year

## 2019-11-02 ENCOUNTER — Other Ambulatory Visit: Payer: Self-pay | Admitting: Obstetrics and Gynecology

## 2019-11-02 DIAGNOSIS — Z1231 Encounter for screening mammogram for malignant neoplasm of breast: Secondary | ICD-10-CM

## 2019-11-03 ENCOUNTER — Other Ambulatory Visit: Payer: Self-pay | Admitting: Internal Medicine

## 2019-12-02 ENCOUNTER — Ambulatory Visit: Payer: Managed Care, Other (non HMO)

## 2019-12-29 ENCOUNTER — Ambulatory Visit: Payer: Managed Care, Other (non HMO)

## 2020-01-03 ENCOUNTER — Ambulatory Visit: Payer: Managed Care, Other (non HMO) | Attending: Internal Medicine

## 2020-01-03 DIAGNOSIS — Z23 Encounter for immunization: Secondary | ICD-10-CM

## 2020-01-03 NOTE — Progress Notes (Signed)
   Covid-19 Vaccination Clinic  Name:  [name]    MRN: [id number] DOB: [birth year]  01/03/2020  [name] was observed post Covid-19 immunization for 15 minutes without incident. She was provided with Vaccine Information Sheet and instruction to access the V-Safe system.   [name] was instructed to call 911 with any severe reactions post vaccine: [name] Difficulty breathing  . Swelling of face and throat  . A fast heartbeat  . A bad rash all over body  . Dizziness and weakness   Immunizations Administered    Name Date Dose VIS Date Route   Pfizer COVID-19 Vaccine 01/03/2020  9:11 AM 0.3 mL 09/24/2019 Intramuscular   Manufacturer: [employer], [employer]   Lot: [id number]   NDC: [address]-8

## 2020-01-26 ENCOUNTER — Ambulatory Visit: Payer: Managed Care, Other (non HMO) | Attending: Internal Medicine

## 2020-01-26 DIAGNOSIS — Z23 Encounter for immunization: Secondary | ICD-10-CM

## 2020-01-26 NOTE — Progress Notes (Signed)
   Covid-19 Vaccination Clinic  Name:  [name]    MRN: [id number] DOB: 02/27/1971  01/26/2020  [name] was observed post Covid-19 immunization for 15 minutes without incident. She was provided with Vaccine Information Sheet and instruction to access the V-Safe system.   [name] was instructed to call 911 with any severe reactions post vaccine: [name] Difficulty breathing  . Swelling of face and throat  . A fast heartbeat  . A bad rash all over body  . Dizziness and weakness   Immunizations Administered    Name Date Dose VIS Date Route   Pfizer COVID-19 Vaccine 01/26/2020 10:27 AM 0.3 mL 09/24/2019 Intramuscular   Manufacturer: [employer], [employer]   Lot: [id number]   NDC: [address]-1

## 2020-01-27 ENCOUNTER — Ambulatory Visit: Payer: Managed Care, Other (non HMO)

## 2020-03-14 ENCOUNTER — Other Ambulatory Visit: Payer: Self-pay | Admitting: Internal Medicine

## 2020-03-14 MED ORDER — DILTIAZEM HCL ER COATED BEADS 180 MG PO CP24: ORAL_CAPSULE | ORAL | 1 refills | Status: DC

## 2020-03-14 NOTE — Telephone Encounter (Signed)
  Patient has 1 pill remaining  1.Medication Requested: diltiazem (CARDIZEM CD) 180 MG 24 hr capsule  2. Pharmacy (Name, Street, City[phone] [address], [place] - [address]  3. On Med List: yes  4. Last Visit with PCP: 09/16/19  5. Next visit date with PCP: n/a   Agent: Please be advised that RX refills may take up to 3 business days. We ask that you follow-up with your pharmacy.

## 2020-03-14 NOTE — Telephone Encounter (Signed)
Reviewed chart pt is up-to-date [facility]  

## 2020-03-14 NOTE — Addendum Note (Signed)
Addended by: Deatra James on: 03/14/2020 09:53 AM   Modules accepted: Orders

## 2020-03-21 ENCOUNTER — Ambulatory Visit: Payer: Managed Care, Other (non HMO)

## 2020-05-02 ENCOUNTER — Other Ambulatory Visit: Payer: Self-pay | Admitting: Internal Medicine

## 2020-05-07 ENCOUNTER — Other Ambulatory Visit: Payer: Self-pay | Admitting: Internal Medicine

## 2020-05-08 ENCOUNTER — Other Ambulatory Visit: Payer: Self-pay

## 2020-05-08 NOTE — Telephone Encounter (Signed)
[place] Controlled Database Checked Last filled: 03/12/2020 (300 LOV w/you:  09/16/2019 Next appt w/you: none

## 2020-05-08 NOTE — Telephone Encounter (Signed)
Pt would like all meds sent to [facility]. Disregard previous pending order for alprazolam

## 2020-05-08 NOTE — Telephone Encounter (Signed)
New message    1.Medication Requested:  ALPRAZolam (XANAX) 1 MG tablet  linaclotide (LINZESS) 290 MCG CAPS capsule  diltiazem (CARDIZEM CD) 180 MG 24 hr capsule  2. Pharmacy (Name, Street, [place]):[facility] - [place], [place] - [address], [address]  3. On Med List: Yes   4. Last Visit with PCP: 12.3.20  5. Next visit date with PCP:   Agent: Please be advised that RX refills may take up to 3 business days. We ask that you follow-up with your pharmacy.

## 2020-05-08 NOTE — Telephone Encounter (Signed)
Called pt to get clarification med requests, unable to reach pt, left vm to call back

## 2020-05-10 NOTE — Telephone Encounter (Signed)
° ° °  [facility] scheduled for [facility]/4

## 2020-05-11 NOTE — Telephone Encounter (Signed)
Pt's medications were denied due to her needing an appointment. Pt has now scheduled an OV 8/4 and requetsing short supply of meds

## 2020-05-11 NOTE — Addendum Note (Signed)
Addended by: Marshell Garfinkel on: 05/11/2020 02:25 PM   Modules accepted: Orders

## 2020-05-12 MED ORDER — LINACLOTIDE 290 MCG PO CAPS: 290.0000 ug | ORAL_CAPSULE | Freq: Every day | ORAL | 0 refills | Status: DC

## 2020-05-12 MED ORDER — DILTIAZEM HCL ER COATED BEADS 180 MG PO CP24: ORAL_CAPSULE | ORAL | 0 refills | Status: DC

## 2020-05-12 MED ORDER — ALPRAZOLAM 1 MG PO TABS: ORAL_TABLET | ORAL | 0 refills | Status: DC

## 2020-05-15 NOTE — Telephone Encounter (Signed)
See med refill.

## 2020-05-17 ENCOUNTER — Ambulatory Visit: Payer: Self-pay | Admitting: Internal Medicine

## 2020-05-24 ENCOUNTER — Ambulatory Visit
Admission: RE | Admit: 2020-05-24 | Discharge: 2020-05-24 | Disposition: A | Discharge status: Home / Self Care | Payer: Managed Care, Other (non HMO) | Source: Ambulatory Visit | Attending: Obstetrics and Gynecology | Admitting: Obstetrics and Gynecology

## 2020-05-24 ENCOUNTER — Other Ambulatory Visit: Payer: Self-pay

## 2020-05-24 DIAGNOSIS — Z1231 Encounter for screening mammogram for malignant neoplasm of breast: Secondary | ICD-10-CM

## 2020-06-13 ENCOUNTER — Ambulatory Visit: Payer: Self-pay | Admitting: Internal Medicine

## 2020-06-16 ENCOUNTER — Other Ambulatory Visit: Payer: Self-pay | Admitting: Internal Medicine

## 2020-08-06 ENCOUNTER — Other Ambulatory Visit: Payer: Self-pay | Admitting: Internal Medicine

## 2020-08-16 ENCOUNTER — Other Ambulatory Visit: Payer: Self-pay | Admitting: Internal Medicine

## 2020-08-24 ENCOUNTER — Other Ambulatory Visit: Payer: Self-pay | Admitting: Internal Medicine

## 2020-08-30 ENCOUNTER — Ambulatory Visit: Payer: Managed Care, Other (non HMO) | Admitting: Internal Medicine

## 2020-09-12 ENCOUNTER — Other Ambulatory Visit: Payer: Self-pay

## 2020-09-12 ENCOUNTER — Encounter: Payer: Self-pay | Admitting: Internal Medicine

## 2020-09-12 ENCOUNTER — Ambulatory Visit (INDEPENDENT_AMBULATORY_CARE_PROVIDER_SITE_OTHER): Payer: Managed Care, Other (non HMO) | Admitting: Internal Medicine

## 2020-09-12 VITALS — BP 124/82 | HR 93 | Temp 98.6°F | Ht 67.0 in | Wt 170.4 lb

## 2020-09-12 DIAGNOSIS — F5102 Adjustment insomnia: Secondary | ICD-10-CM

## 2020-09-12 DIAGNOSIS — M542 Cervicalgia: Secondary | ICD-10-CM | POA: Insufficient documentation

## 2020-09-12 DIAGNOSIS — R42 Dizziness and giddiness: Secondary | ICD-10-CM

## 2020-09-12 DIAGNOSIS — Z23 Encounter for immunization: Secondary | ICD-10-CM

## 2020-09-12 DIAGNOSIS — Z0000 Encounter for general adult medical examination without abnormal findings: Secondary | ICD-10-CM | POA: Diagnosis not present

## 2020-09-12 MED ORDER — ESZOPICLONE 3 MG PO TABS: 3.0000 mg | ORAL_TABLET | Freq: Every evening | ORAL | 1 refills | Status: DC | PRN

## 2020-09-12 MED ORDER — MECLIZINE HCL 12.5 MG PO TABS: 12.5000 mg | ORAL_TABLET | Freq: Three times a day (TID) | ORAL | 1 refills | Status: AC | PRN

## 2020-09-12 NOTE — Assessment & Plan Note (Signed)
Benign Positional Vertigo symptoms Start Meclizine. Start Brandt - Daroff exercise several times a day as dirrected.  

## 2020-09-12 NOTE — Patient Instructions (Signed)
Benign Positional Vertigo symptoms Start Meclizine. Start Brandt - Daroff exercise several times a day as dirrected.  

## 2020-09-12 NOTE — Assessment & Plan Note (Signed)
S/p neck fusion surgery in Jul 2021 Dr Yetta Barre

## 2020-09-12 NOTE — Progress Notes (Signed)
Subjective:  Patient ID: [name], female    DOB: Mar 25, 1971  Age: 49 y.o. MRN: [id number]  CC: Acute Visit (dizzy spells off/on x 1-2 weeks.  wants flu and shingles vaccines.)   HPI [name] presents for dizziness x 2 wks off and on Recovering from neck surg in Jul 2021 Dr Yetta Barre Working part time   Outpatient Medications Prior to Visit  Medication Sig Dispense Refill  . ALPRAZolam (XANAX) 1 MG tablet TAKE 1/2 TABLET BY MOUTH TWICE A DAY AS NEEDED FOR ANXIETY DONT TAKE WITH LUNESTA 30 tablet 1  . amitriptyline (ELAVIL) 25 MG tablet TAKE 1-2 TABLETS BY MOUTH AT BEDTIME. 180 tablet 3  . aspirin 81 MG tablet Take 81 mg by mouth daily.      . [facility] D3 50 MCG (2000 UT) CAPS TAKE ONE CAPSULE BY MOUTH EVERY DAY 100 capsule 3  . D3-50 50000 units capsule TAKE ONE CAPSULE BY MOUTH ONE TIME PER WEEK 8 capsule 3  . diltiazem (CARDIZEM CD) 180 MG 24 hr capsule TAKE 1 CAPSULE BY MOUTH  DAILY Annual appt due in Dec must see provider for future refills 30 capsule 0  . Eszopiclone 3 MG TABS Take 1 tablet (3 mg total) by mouth at bedtime as needed. Take immediately before bedtime 90 tablet 1  . fish oil-omega-3 fatty acids 1000 MG capsule Take 1 g by mouth daily.    [name] LINZESS 290 MCG CAPS capsule TAKE ONE CAPSULE BY MOUTH EVERY DAY AT BREAKFAST 90 capsule 3  . omeprazole (PRILOSEC) 20 MG capsule TAKE 1 CAPSULE BY MOUTH  DAILY 90 capsule 0  . spironolactone (ALDACTONE) 25 MG tablet TAKE 1 TABLET BY MOUTH  DAILY 90 tablet 0  . escitalopram (LEXAPRO) 20 MG tablet Take 1 tablet (20 mg total) by mouth daily. (Patient not taking: Reported on 09/12/2020) 90 tablet 3  . fluconazole (DIFLUCAN) 150 MG tablet Take 1 tablet (150 mg total) by mouth daily. (Patient not taking: Reported on 09/12/2020) 1 tablet 1   No facility-administered medications prior to visit.    ROS: Review of Systems  Constitutional: Negative for activity change, appetite change, chills, fatigue and unexpected weight  change.  HENT: Negative for congestion, mouth sores and sinus pressure.   Eyes: Negative for visual disturbance.  Respiratory: Negative for cough and chest tightness.   Gastrointestinal: Negative for abdominal pain and nausea.  Genitourinary: Negative for difficulty urinating, frequency and vaginal pain.  Musculoskeletal: Positive for neck pain. Negative for back pain and gait problem.  Skin: Negative for pallor and rash.  Neurological: Positive for dizziness and headaches. Negative for tremors, weakness, light-headedness and numbness.  Psychiatric/Behavioral: Negative for confusion and sleep disturbance.    Objective:  BP 124/82   Pulse 93   Temp 98.6 F (37 C) (Oral)   Ht 5\' 7"  (1.702 m)   Wt 170 lb 6.4 oz (77.3 kg)   SpO2 97%   BMI 26.69 kg/m   BP Readings from Last 3 Encounters:  09/12/20 124/82  09/16/19 126/82  05/26/18 118/80    Wt Readings from Last 3 Encounters:  09/12/20 170 lb 6.4 oz (77.3 kg)  09/16/19 149 lb (67.6 kg)  05/26/18 145 lb (65.8 kg)    Physical Exam Constitutional:      General: She is not in acute distress.    Appearance: She is well-developed.  HENT:     Head: Normocephalic.     Right Ear: External ear normal.     Left  Ear: External ear normal.     Nose: Nose normal.  Eyes:     General:        Right eye: No discharge.        Left eye: No discharge.     Conjunctiva/sclera: Conjunctivae normal.     Pupils: Pupils are equal, round, and reactive to light.  Neck:     Thyroid: No thyromegaly.     Vascular: No JVD.     Trachea: No tracheal deviation.  Cardiovascular:     Rate and Rhythm: Normal rate and regular rhythm.     Heart sounds: Normal heart sounds.  Pulmonary:     Effort: No respiratory distress.     Breath sounds: No stridor. No wheezing.  Abdominal:     General: Bowel sounds are normal. There is no distension.     Palpations: Abdomen is soft. There is no mass.     Tenderness: There is no abdominal tenderness. There is no  guarding or rebound.  Musculoskeletal:        General: No tenderness.     Cervical back: Normal range of motion and neck supple.  Lymphadenopathy:     Cervical: No cervical adenopathy.  Skin:    Findings: No erythema or rash.  Neurological:     Cranial Nerves: No cranial nerve deficit.     Motor: No abnormal muscle tone.     Coordination: Coordination normal.     Deep Tendon Reflexes: Reflexes normal.  Psychiatric:        Behavior: Behavior normal.        Thought Content: Thought content normal.        Judgment: Judgment normal.     Lab Results  Component Value Date   WBC 6.3 09/16/2019   HGB 12.5 09/16/2019   HCT 38.2 09/16/2019   PLT 338.0 09/16/2019   GLUCOSE 94 09/16/2019   CHOL 216 (H) 09/16/2019   TRIG 70.0 09/16/2019   HDL 80.90 09/16/2019   LDLDIRECT 120.8 08/04/2013   LDLCALC 121 (H) 09/16/2019   ALT 25 09/16/2019   AST 25 09/16/2019   NA 140 09/16/2019   K 4.0 09/16/2019   CL 104 09/16/2019   CREATININE 0.71 09/16/2019   BUN 9 09/16/2019   CO2 29 09/16/2019   TSH 1.04 09/16/2019    MM 3D SCREEN BREAST BILATERAL  Result Date: 05/24/2020 CLINICAL DATA:  Screening. EXAM: DIGITAL SCREENING BILATERAL MAMMOGRAM WITH TOMO AND CAD COMPARISON:  Previous exam(s). ACR Breast Density Category b: There are scattered areas of fibroglandular density. FINDINGS: There are no findings suspicious for malignancy. Images were processed with CAD. IMPRESSION: No mammographic evidence of malignancy. A result letter of this screening mammogram will be mailed directly to the patient. RECOMMENDATION: Screening mammogram in one year. (Code:SM-B-01Y) BI-RADS CATEGORY  1: Negative. Electronically Signed   By: Romona Curls M.D.   On: 05/24/2020 11:28    Assessment & Plan:    Sonda Primes, MD

## 2020-09-12 NOTE — Assessment & Plan Note (Signed)
lunesta prn  Potential benefits of a long term benzodiazepines  use as well as potential risks  and complications were explained to the patient and were aknowledged.

## 2020-09-22 ENCOUNTER — Other Ambulatory Visit: Payer: Self-pay

## 2020-09-22 ENCOUNTER — Other Ambulatory Visit (INDEPENDENT_AMBULATORY_CARE_PROVIDER_SITE_OTHER): Payer: Managed Care, Other (non HMO)

## 2020-09-22 ENCOUNTER — Other Ambulatory Visit: Payer: Managed Care, Other (non HMO)

## 2020-09-22 DIAGNOSIS — R42 Dizziness and giddiness: Secondary | ICD-10-CM

## 2020-09-22 DIAGNOSIS — Z0000 Encounter for general adult medical examination without abnormal findings: Secondary | ICD-10-CM

## 2020-09-22 LAB — CBC WITH DIFFERENTIAL/PLATELET
Basophils Absolute: 0 10*3/uL (ref 0.0–0.1)
Basophils Relative: 0.4 % (ref 0.0–3.0)
Eosinophils Absolute: 0.1 10*3/uL (ref 0.0–0.7)
Eosinophils Relative: 1.7 % (ref 0.0–5.0)
HCT: 38.4 % (ref 36.0–46.0)
Hemoglobin: 12.5 g/dL (ref 12.0–15.0)
Lymphocytes Relative: 43.3 % (ref 12.0–46.0)
Lymphs Abs: 3 10*3/uL (ref 0.7–4.0)
MCHC: 32.5 g/dL (ref 30.0–36.0)
MCV: 83.9 fl (ref 78.0–100.0)
Monocytes Absolute: 0.5 10*3/uL (ref 0.1–1.0)
Monocytes Relative: 6.6 % (ref 3.0–12.0)
Neutro Abs: 3.3 10*3/uL (ref 1.4–7.7)
Neutrophils Relative %: 48 % (ref 43.0–77.0)
Platelets: 344 10*3/uL (ref 150.0–400.0)
RBC: 4.57 Mil/uL (ref 3.87–5.11)
RDW: 14.4 % (ref 11.5–15.5)
WBC: 6.9 10*3/uL (ref 4.0–10.5)

## 2020-09-22 LAB — COMPREHENSIVE METABOLIC PANEL
ALT: 13 U/L (ref 0–35)
AST: 18 U/L (ref 0–37)
Albumin: 4.5 g/dL (ref 3.5–5.2)
Alkaline Phosphatase: 94 U/L (ref 39–117)
BUN: 9 mg/dL (ref 6–23)
CO2: 28 mEq/L (ref 19–32)
Calcium: 10 mg/dL (ref 8.4–10.5)
Chloride: 105 mEq/L (ref 96–112)
Creatinine, Ser: 0.78 mg/dL (ref 0.40–1.20)
GFR: 87.94 mL/min (ref >60.00)
Glucose, Bld: 86 mg/dL (ref 70–99)
Potassium: 3.3 mEq/L — ABNORMAL LOW (ref 3.5–5.1)
Sodium: 141 mEq/L (ref 135–145)
Total Bilirubin: 0.3 mg/dL (ref 0.2–1.2)
Total Protein: 7.7 g/dL (ref 6.0–8.3)

## 2020-09-22 LAB — VITAMIN D 25 HYDROXY (VIT D DEFICIENCY, FRACTURES): VITD: 61.04 ng/mL (ref 30.00–100.00)

## 2020-09-22 LAB — URINALYSIS
Bilirubin Urine: NEGATIVE
Hgb urine dipstick: NEGATIVE
Ketones, ur: NEGATIVE
Leukocytes,Ua: NEGATIVE
Nitrite: NEGATIVE
Specific Gravity, Urine: 1.025 (ref 1.000–1.030)
Total Protein, Urine: NEGATIVE
Urine Glucose: NEGATIVE
Urobilinogen, UA: 0.2 (ref 0.0–1.0)
pH: 6 (ref 5.0–8.0)

## 2020-09-22 LAB — LIPID PANEL
Cholesterol: 201 mg/dL — ABNORMAL HIGH (ref 0–200)
HDL: 70.9 mg/dL (ref >39.00)
LDL Cholesterol: 113 mg/dL — ABNORMAL HIGH (ref 0–99)
NonHDL: 130.09
Total CHOL/HDL Ratio: 3
Triglycerides: 84 mg/dL (ref 0.0–149.0)
VLDL: 16.8 mg/dL (ref 0.0–40.0)

## 2020-09-22 LAB — TSH: TSH: 2.01 u[IU]/mL (ref 0.35–4.50)

## 2020-09-22 LAB — VITAMIN B12: Vitamin B-12: 711 pg/mL (ref 211–911)

## 2020-09-26 ENCOUNTER — Other Ambulatory Visit: Payer: Self-pay | Admitting: Internal Medicine

## 2020-09-26 MED ORDER — POTASSIUM CHLORIDE CRYS ER 10 MEQ PO TBCR: 10.0000 meq | EXTENDED_RELEASE_TABLET | Freq: Every day | ORAL | 0 refills | Status: DC

## 2020-09-27 ENCOUNTER — Ambulatory Visit (INDEPENDENT_AMBULATORY_CARE_PROVIDER_SITE_OTHER): Payer: Managed Care, Other (non HMO) | Admitting: Internal Medicine

## 2020-09-27 ENCOUNTER — Other Ambulatory Visit: Payer: Self-pay | Admitting: Internal Medicine

## 2020-09-27 ENCOUNTER — Other Ambulatory Visit: Payer: Self-pay

## 2020-09-27 ENCOUNTER — Encounter: Payer: Self-pay | Admitting: Internal Medicine

## 2020-09-27 DIAGNOSIS — R42 Dizziness and giddiness: Secondary | ICD-10-CM

## 2020-09-27 DIAGNOSIS — Z0000 Encounter for general adult medical examination without abnormal findings: Secondary | ICD-10-CM | POA: Diagnosis not present

## 2020-09-27 NOTE — Assessment & Plan Note (Signed)
We discussed age appropriate health related issues, including available/recomended screening tests and vaccinations. We discussed a need for adhering to healthy diet and exercise. Labs were reviewed . All questions were answered. Pt declined shingles shot today Colon - pt said next year 2021

## 2020-09-27 NOTE — Progress Notes (Signed)
Subjective:  Patient ID: [name], female    DOB: 1971/03/28  Age: 49 y.o. MRN: [id number]  CC: Annual Exam   HPI [name] presents for a well exam F/u dizziness - better F/u neck pain - better Outpatient Medications Prior to Visit  Medication Sig Dispense Refill  . ALPRAZolam (XANAX) 1 MG tablet TAKE 1/2 TABLET BY MOUTH TWICE A DAY AS NEEDED FOR ANXIETY DONT TAKE WITH LUNESTA 30 tablet 1  . amitriptyline (ELAVIL) 25 MG tablet TAKE 1-2 TABLETS BY MOUTH AT BEDTIME. 180 tablet 3  . aspirin 81 MG tablet Take 81 mg by mouth daily.    . [facility] D3 50 MCG (2000 UT) CAPS TAKE ONE CAPSULE BY MOUTH EVERY DAY 100 capsule 3  . diltiazem (CARDIZEM CD) 180 MG 24 hr capsule TAKE 1 CAPSULE BY MOUTH  DAILY Annual appt due in Dec must see provider for future refills 30 capsule 0  . Eszopiclone 3 MG TABS Take 1 tablet (3 mg total) by mouth at bedtime as needed. Take immediately before bedtime 90 tablet 1  . fish oil-omega-3 fatty acids 1000 MG capsule Take 1 g by mouth daily.    [name] LINZESS 290 MCG CAPS capsule TAKE ONE CAPSULE BY MOUTH EVERY DAY AT BREAKFAST 90 capsule 3  . meclizine (ANTIVERT) 12.5 MG tablet Take 1 tablet (12.5 mg total) by mouth 3 (three) times daily as needed for dizziness. 60 tablet 1  . omeprazole (PRILOSEC) 20 MG capsule TAKE 1 CAPSULE BY MOUTH  DAILY 90 capsule 0  . potassium chloride (KLOR-CON) 10 MEQ tablet Take 1 tablet (10 mEq total) by mouth daily. 14 tablet 0  . spironolactone (ALDACTONE) 25 MG tablet TAKE 1 TABLET BY MOUTH  DAILY 90 tablet 0  . D3-50 50000 units capsule TAKE ONE CAPSULE BY MOUTH ONE TIME PER WEEK (Patient not taking: Reported on 09/27/2020) 8 capsule 3  . escitalopram (LEXAPRO) 20 MG tablet Take 1 tablet (20 mg total) by mouth daily. (Patient not taking: No sig reported) 90 tablet 3  . fluconazole (DIFLUCAN) 150 MG tablet Take 1 tablet (150 mg total) by mouth daily. (Patient not taking: No sig reported) 1 tablet 1   No facility-administered  medications prior to visit.    ROS: Review of Systems  Constitutional: Negative for activity change, appetite change, chills, fatigue and unexpected weight change.  HENT: Negative for congestion, mouth sores and sinus pressure.   Eyes: Negative for visual disturbance.  Respiratory: Negative for cough and chest tightness.   Gastrointestinal: Negative for abdominal pain and nausea.  Genitourinary: Negative for difficulty urinating, frequency and vaginal pain.  Musculoskeletal: Negative for back pain and gait problem.  Skin: Negative for pallor and rash.  Neurological: Negative for dizziness, tremors, weakness, numbness and headaches.  Psychiatric/Behavioral: Negative for confusion, sleep disturbance and suicidal ideas.    Objective:  BP 130/90 (BP Location: Left Arm)   Pulse 87   Temp 99.3 F (37.4 C) (Oral)   Ht 5\' 7"  (1.702 m)   Wt 170 lb (77.1 kg)   SpO2 98%   BMI 26.63 kg/m   BP Readings from Last 3 Encounters:  09/27/20 130/90  09/12/20 124/82  09/16/19 126/82    Wt Readings from Last 3 Encounters:  09/27/20 170 lb (77.1 kg)  09/12/20 170 lb 6.4 oz (77.3 kg)  09/16/19 149 lb (67.6 kg)    Physical Exam Constitutional:      General: She is not in acute distress.    Appearance:  She is well-developed.  HENT:     Head: Normocephalic.     Right Ear: External ear normal.     Left Ear: External ear normal.     Nose: Nose normal.     Mouth/Throat:     Mouth: Oropharynx is clear and moist.  Eyes:     General:        Right eye: No discharge.        Left eye: No discharge.     Conjunctiva/sclera: Conjunctivae normal.     Pupils: Pupils are equal, round, and reactive to light.  Neck:     Thyroid: No thyromegaly.     Vascular: No JVD.     Trachea: No tracheal deviation.  Cardiovascular:     Rate and Rhythm: Normal rate and regular rhythm.     Heart sounds: Normal heart sounds.  Pulmonary:     Effort: No respiratory distress.     Breath sounds: No stridor. No  wheezing.  Abdominal:     General: Bowel sounds are normal. There is no distension.     Palpations: Abdomen is soft. There is no mass.     Tenderness: There is no abdominal tenderness. There is no guarding or rebound.  Musculoskeletal:        General: No tenderness or edema.     Cervical back: Normal range of motion and neck supple.  Lymphadenopathy:     Cervical: No cervical adenopathy.  Skin:    Findings: No erythema or rash.  Neurological:     Cranial Nerves: No cranial nerve deficit.     Motor: No abnormal muscle tone.     Coordination: Coordination normal.     Deep Tendon Reflexes: Reflexes normal.  Psychiatric:        Mood and Affect: Mood and affect normal.        Behavior: Behavior normal.        Thought Content: Thought content normal.        Judgment: Judgment normal.    cerv spine - tender in post-op area  Lab Results  Component Value Date   WBC 6.9 09/22/2020   HGB 12.5 09/22/2020   HCT 38.4 09/22/2020   PLT 344.0 09/22/2020   GLUCOSE 86 09/22/2020   CHOL 201 (H) 09/22/2020   TRIG 84.0 09/22/2020   HDL 70.90 09/22/2020   LDLDIRECT 120.8 08/04/2013   LDLCALC 113 (H) 09/22/2020   ALT 13 09/22/2020   AST 18 09/22/2020   NA 141 09/22/2020   K 3.3 (L) 09/22/2020   CL 105 09/22/2020   CREATININE 0.78 09/22/2020   BUN 9 09/22/2020   CO2 28 09/22/2020   TSH 2.01 09/22/2020    MM 3D SCREEN BREAST BILATERAL  Result Date: 05/24/2020 CLINICAL DATA:  Screening. EXAM: DIGITAL SCREENING BILATERAL MAMMOGRAM WITH TOMO AND CAD COMPARISON:  Previous exam(s). ACR Breast Density Category b: There are scattered areas of fibroglandular density. FINDINGS: There are no findings suspicious for malignancy. Images were processed with CAD. IMPRESSION: No mammographic evidence of malignancy. A result letter of this screening mammogram will be mailed directly to the patient. RECOMMENDATION: Screening mammogram in one year. (Code:SM-B-01Y) BI-RADS CATEGORY  1: Negative. Electronically  Signed   By: Romona Curls M.D.   On: 05/24/2020 11:28    Assessment & Plan:    Sonda Primes, MD

## 2020-09-27 NOTE — Assessment & Plan Note (Signed)
Improving.

## 2020-10-25 ENCOUNTER — Other Ambulatory Visit: Payer: Self-pay | Admitting: Neurological Surgery

## 2020-10-25 DIAGNOSIS — M5413 Radiculopathy, cervicothoracic region: Secondary | ICD-10-CM

## 2020-10-26 ENCOUNTER — Other Ambulatory Visit: Payer: Self-pay

## 2020-10-26 ENCOUNTER — Ambulatory Visit
Admission: RE | Admit: 2020-10-26 | Discharge: 2020-10-26 | Disposition: A | Discharge status: Home / Self Care | Payer: Managed Care, Other (non HMO) | Source: Ambulatory Visit | Attending: Neurological Surgery | Admitting: Neurological Surgery

## 2020-10-26 DIAGNOSIS — M5413 Radiculopathy, cervicothoracic region: Secondary | ICD-10-CM

## 2020-11-03 ENCOUNTER — Other Ambulatory Visit: Payer: Self-pay | Admitting: Internal Medicine

## 2020-11-09 ENCOUNTER — Other Ambulatory Visit: Payer: Self-pay | Admitting: Internal Medicine

## 2020-11-14 ENCOUNTER — Other Ambulatory Visit: Payer: Self-pay | Admitting: Internal Medicine

## 2020-11-22 ENCOUNTER — Other Ambulatory Visit: Payer: Self-pay | Admitting: Internal Medicine

## 2020-11-22 NOTE — Telephone Encounter (Signed)
Check [place] registry last filled 10/09/2020../lmb 

## 2020-11-30 NOTE — Telephone Encounter (Signed)
Patient calling for status for refill on ALPRAZolam (XANAX) 1 MG tablet  amitriptyline (ELAVIL) 25 MG tablet

## 2020-12-13 DIAGNOSIS — I471 Supraventricular tachycardia, unspecified: Secondary | ICD-10-CM | POA: Insufficient documentation

## 2020-12-28 ENCOUNTER — Other Ambulatory Visit: Payer: Self-pay | Admitting: Internal Medicine

## 2021-02-18 ENCOUNTER — Other Ambulatory Visit: Payer: Self-pay | Admitting: Internal Medicine

## 2021-04-10 ENCOUNTER — Other Ambulatory Visit: Payer: Self-pay

## 2021-04-10 ENCOUNTER — Ambulatory Visit (HOSPITAL_COMMUNITY)
Admission: EM | Admit: 2021-04-10 | Discharge: 2021-04-10 | Disposition: A | Discharge status: Home / Self Care | Payer: Managed Care, Other (non HMO) | Attending: Family Medicine | Admitting: Family Medicine

## 2021-04-10 ENCOUNTER — Encounter (HOSPITAL_COMMUNITY): Payer: Self-pay

## 2021-04-10 DIAGNOSIS — R21 Rash and other nonspecific skin eruption: Secondary | ICD-10-CM | POA: Diagnosis not present

## 2021-04-10 MED ORDER — TRIAMCINOLONE ACETONIDE 0.1 % EX CREA: 1.0000 "application " | TOPICAL_CREAM | Freq: Two times a day (BID) | CUTANEOUS | 0 refills | Status: DC

## 2021-04-10 NOTE — ED Provider Notes (Signed)
[facility]   [id number] 04/10/21 Arrival Time: 1052  ASSESSMENT & PLAN:  1. Acute blistering eruption of skin    Unclear etiology.  As needed: Meds ordered this encounter  Medications   triamcinolone cream (KENALOG) 0.1 %    Sig: Apply 1 application topically 2 (two) times daily.    Dispense:  15 g    Refill:  0   No signs of infection. Reassured her. I expect this to resolve without issue.  Will follow up with PCP or here if worsening or failing to improve as anticipated. Reviewed expectations re: course of current medical issues. Questions answered. Outlined signs and symptoms indicating need for more acute intervention. Patient verbalized understanding. After Visit Summary given.   SUBJECTIVE:  [name] is a 50 y.o. female who presents with a skin complaint. Linear blistering noted on left lower side of abdomen today. Questions if her dog scratched her. No pain. Some itching. Afebrile. Otherwise well. No bleeding.   OBJECTIVE: Vitals:   04/10/21 1134  BP: 136/90  Pulse: 88  Resp: 17  Temp: 99 F (37.2 C)  TempSrc: Oral  SpO2: 100%    General appearance: alert; no distress Extremities: no edema; moves all extremities normally Skin: warm and dry; signs of infection: no; linear blistering measuring approx 6 cm x 0.25 cm noted on side of left abdomen; no bleeding; non-tender Psychological: alert and cooperative; normal mood and affect  Allergies  Allergen Reactions   Oxycodone Itching    Past Medical History:  Diagnosis Date   Allergic rhinitis    Anemia    NOS   Chicken pox    Chicken pox    Dysmenorrhea    Dysuria    GERD (gastroesophageal reflux disease)    Headache(784.0)    HTN (hypertension)    Hx of migraines    Menorrhagia    Missed abortion    x 2   SVT (supraventricular tachycardia) (HCC)    h/o r/t stress, no problems   Termination of pregnancy    x 1   Social History   Socioeconomic History   Marital  status: Married    Spouse name: Not on file   Number of children: Not on file   Years of education: Not on file   Highest education level: Not on file  Occupational History   Not on file  Tobacco Use   Smoking status: Never   Smokeless tobacco: Never  Substance and Sexual Activity   Alcohol use: Yes    Comment: socially   Drug use: No   Sexual activity: Yes    Comment: husband - vasectomy  Other Topics Concern   Not on file  Social History Narrative   Not on file   Social Determinants of Health   Financial Resource Strain: Not on file  Food Insecurity: Not on file  Transportation Needs: Not on file  Physical Activity: Not on file  Stress: Not on file  Social Connections: Not on file  Intimate Partner Violence: Not on file   Family History  Problem Relation Age of Onset   Stroke Mother    Hypertension Mother    Diabetes Mother    Stroke Father    Hypertension Father    Cancer Father 3       colon ca w/liver mets   Heart disease Father        defibrilator   Past Surgical History:  Procedure Laterality Date   ADENOIDECTOMY  1978   APPENDECTOMY  1992   BACK SURGERY     rod in back r/t scolosis   DILATION AND CURETTAGE OF UTERUS     HERNIA REPAIR  08/2006   umbilical   hysterocospy     SVD     x 2   TONSILLECTOMY  1978   UPPER GASTROINTESTINAL ENDOSCOPY        Mardella Layman, MD 04/10/21 [phone]

## 2021-04-10 NOTE — ED Triage Notes (Signed)
Pt presents with a line along the left side of abdomen.   Pt states she think it is a scratch.  States she has not seen a scratch like that before.

## 2021-04-21 ENCOUNTER — Other Ambulatory Visit: Payer: Self-pay | Admitting: Internal Medicine

## 2021-05-14 ENCOUNTER — Telehealth: Payer: Self-pay | Admitting: Internal Medicine

## 2021-05-14 NOTE — Telephone Encounter (Signed)
Should have visit but can add pepcid otc in the meantime.

## 2021-05-14 NOTE — Telephone Encounter (Signed)
MD is out of the office pls advise../lmb 

## 2021-05-14 NOTE — Telephone Encounter (Signed)
Patient called in about having severe acid reflux  Currently taking: omeprazole (PRILOSEC) 20 MG capsule  Patient says rx is not working and wants to know if theres anything else Dr can prescribe her as soon as possible  Pharmacy:  [id number] [place], [place] - [address]  Phone:  [phone] Fax:  [phone]

## 2021-05-14 NOTE — Telephone Encounter (Signed)
Notified pt w/MD response.../lmb 

## 2021-05-17 NOTE — Telephone Encounter (Signed)
Called pt there was no answer LMOM no rx was sent in provider states to use over the counter PEPCID.Marland KitchenShearon Stalls

## 2021-05-17 NOTE — Telephone Encounter (Signed)
Patient wants to know if the temporary medication was called into her pharmacy.   Please advise- still having bad acid reflux

## 2021-06-11 ENCOUNTER — Other Ambulatory Visit: Payer: Self-pay

## 2021-06-11 ENCOUNTER — Encounter: Payer: Self-pay | Admitting: Internal Medicine

## 2021-06-11 ENCOUNTER — Telehealth: Payer: Self-pay | Admitting: Internal Medicine

## 2021-06-11 ENCOUNTER — Ambulatory Visit (INDEPENDENT_AMBULATORY_CARE_PROVIDER_SITE_OTHER): Payer: Managed Care, Other (non HMO) | Admitting: Internal Medicine

## 2021-06-11 DIAGNOSIS — L910 Hypertrophic scar: Secondary | ICD-10-CM | POA: Diagnosis not present

## 2021-06-11 DIAGNOSIS — F4321 Adjustment disorder with depressed mood: Secondary | ICD-10-CM

## 2021-06-11 DIAGNOSIS — F5102 Adjustment insomnia: Secondary | ICD-10-CM | POA: Diagnosis not present

## 2021-06-11 DIAGNOSIS — K219 Gastro-esophageal reflux disease without esophagitis: Secondary | ICD-10-CM

## 2021-06-11 MED ORDER — VENLAFAXINE HCL ER 37.5 MG PO CP24: 37.5000 mg | ORAL_CAPSULE | Freq: Every day | ORAL | 3 refills | Status: DC

## 2021-06-11 MED ORDER — DEXLANSOPRAZOLE 60 MG PO CPDR: 60.0000 mg | DELAYED_RELEASE_CAPSULE | Freq: Every morning | ORAL | 3 refills | Status: DC

## 2021-06-11 MED ORDER — FAMOTIDINE 20 MG PO TABS: 20.0000 mg | ORAL_TABLET | Freq: Every day | ORAL | 3 refills | Status: DC

## 2021-06-11 NOTE — Telephone Encounter (Signed)
1.Medication Requested: dexlansoprazole (DEXILANT) 60 MG capsule  2. Pharmacy (Name, Street, [place]): [facility] [place] - [address] [place]  Phone:  [phone] Fax:  [phone]   3. On Med List: yes   4. Last Visit with PCP: 12.15.21  5. Next visit date with PCP: 10.17.22   Agent: Please be advised that RX refills may take up to 3 business days. We ask that you follow-up with your pharmacy.

## 2021-06-11 NOTE — Assessment & Plan Note (Signed)
lunesta prn  Potential benefits of a long term benzodiazepines  use as well as potential risks  and complications were explained to the patient and were aknowledged.

## 2021-06-11 NOTE — Telephone Encounter (Signed)
Resent rx to [facility].Marland KitchenRaechel Chute

## 2021-06-11 NOTE — Progress Notes (Signed)
Subjective:  Patient ID: [name], female    DOB: 03-Sep-1971  Age: 50 y.o. MRN: [id number]  CC: Gastroesophageal Reflux   HPI [name] presents for anxiety, stress, grief GERD - worse, HTN Pt stopped Lexapro C/o painful scar post-op  Outpatient Medications Prior to Visit  Medication Sig Dispense Refill   ALPRAZolam (XANAX) 1 MG tablet TAKE 1/2 TABLET BY MOUTH TWICE A DAY AS NEEDED FOR ANXIETY DONT TAKE WITH LUNESTA 30 tablet 2   amitriptyline (ELAVIL) 25 MG tablet TAKE 1 TO 2 TABLETS BY MOUTH AT BEDTIME 180 tablet 2   aspirin 81 MG tablet Take 81 mg by mouth daily.     [facility] D3 50 MCG (2000 UT) CAPS TAKE ONE CAPSULE BY MOUTH EVERY DAY 100 capsule 3   diltiazem (CARTIA XT) 180 MG 24 hr capsule TAKE 1 CAPSULE BY MOUTH  DAILY 90 capsule 3   Eszopiclone 3 MG TABS TAKE 1 TABLET (3 MG TOTAL) BY MOUTH AT BEDTIME AS NEEDED. TAKE IMMEDIATELY BEFORE BEDTIME 30 tablet 5   fish oil-omega-3 fatty acids 1000 MG capsule Take 1 g by mouth daily.     LINZESS 290 MCG CAPS capsule TAKE 1 CAPSULE(290 MCG) BY MOUTH DAILY BEFORE BREAKFAST 90 capsule 3   meclizine (ANTIVERT) 12.5 MG tablet Take 1 tablet (12.5 mg total) by mouth 3 (three) times daily as needed for dizziness. 60 tablet 1   omeprazole (PRILOSEC) 20 MG capsule TAKE 1 CAPSULE BY MOUTH  DAILY 90 capsule 2   potassium chloride (KLOR-CON) 10 MEQ tablet Take 1 tablet (10 mEq total) by mouth daily. 14 tablet 0   spironolactone (ALDACTONE) 25 MG tablet TAKE 1 TABLET BY MOUTH  DAILY 90 tablet 3   triamcinolone cream (KENALOG) 0.1 % Apply 1 application topically 2 (two) times daily. 15 g 0   No facility-administered medications prior to visit.    ROS: Review of Systems  Constitutional:  Positive for fatigue. Negative for activity change, appetite change, chills and unexpected weight change.  HENT:  Negative for congestion, mouth sores and sinus pressure.   Eyes:  Negative for visual disturbance.  Respiratory:  Negative for cough  and chest tightness.   Gastrointestinal:  Negative for abdominal pain and nausea.  Genitourinary:  Negative for difficulty urinating, frequency and vaginal pain.  Musculoskeletal:  Positive for back pain. Negative for gait problem.  Skin:  Negative for pallor and rash.  Neurological:  Negative for dizziness, tremors, weakness, numbness and headaches.  Psychiatric/Behavioral:  Positive for dysphoric mood. Negative for confusion, sleep disturbance and suicidal ideas. The patient is nervous/anxious.    Objective:  BP 124/83 (BP Location: Left Arm)   Pulse 85   Temp 97.7 F (36.5 C) (Oral)   SpO2 98%   BP Readings from Last 3 Encounters:  06/11/21 124/83  04/10/21 136/90  09/27/20 130/90    Wt Readings from Last 3 Encounters:  09/27/20 170 lb (77.1 kg)  09/12/20 170 lb 6.4 oz (77.3 kg)  09/16/19 149 lb (67.6 kg)    Physical Exam Constitutional:      General: She is not in acute distress.    Appearance: She is well-developed.  HENT:     Head: Normocephalic.     Right Ear: External ear normal.     Left Ear: External ear normal.     Nose: Nose normal.  Eyes:     General:        Right eye: No discharge.  Left eye: No discharge.     Conjunctiva/sclera: Conjunctivae normal.     Pupils: Pupils are equal, round, and reactive to light.  Neck:     Thyroid: No thyromegaly.     Vascular: No JVD.     Trachea: No tracheal deviation.  Cardiovascular:     Rate and Rhythm: Normal rate and regular rhythm.     Heart sounds: Normal heart sounds.  Pulmonary:     Effort: No respiratory distress.     Breath sounds: No stridor. No wheezing.  Abdominal:     General: Bowel sounds are normal. There is no distension.     Palpations: Abdomen is soft. There is no mass.     Tenderness: There is no abdominal tenderness. There is no guarding or rebound.  Musculoskeletal:        General: No tenderness.     Cervical back: Normal range of motion and neck supple. No rigidity.   Lymphadenopathy:     Cervical: No cervical adenopathy.  Skin:    Findings: No erythema or rash.  Neurological:     Mental Status: She is oriented to person, place, and time.     Cranial Nerves: No cranial nerve deficit.     Motor: No abnormal muscle tone.     Coordination: Coordination normal.     Deep Tendon Reflexes: Reflexes normal.  Psychiatric:        Behavior: Behavior normal.        Thought Content: Thought content normal.        Judgment: Judgment normal.  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  Assessment & Plan:    Sonda Primes, MD

## 2021-06-11 NOTE — Assessment & Plan Note (Addendum)
L neck base post-op 2022 Discussed options Plastic surgery ref

## 2021-06-11 NOTE — Assessment & Plan Note (Signed)
Start Effexor XR

## 2021-06-11 NOTE — Assessment & Plan Note (Signed)
Worse Start Dexilant, Pepcid D/c Pepcid

## 2021-06-13 NOTE — Telephone Encounter (Signed)
   Patient calling to report medication too costly $75 for 30 day, $188 for 90 day supply  Seeking alternative

## 2021-06-13 NOTE — Telephone Encounter (Signed)
What affordable alternatives did her insurance offer?  Thanks

## 2021-06-20 NOTE — Telephone Encounter (Signed)
Check insurance Dexilant is covered but has a high copay due to tier. The only other meds that plan covers are Dexilant 60MG  dr Dexlansoprazole 60 Mg CpD Not Required Nexium Packet 2.5 Mg Grps Not Required Nexium Packet 5 Mg Grps Not Required Omeprazole 20 Mg Cpdr Not Required Pantoprazole 20 Mg TbEC Not Required Rabeprazole (Tbec)             Not Required

## 2021-06-21 MED ORDER — RABEPRAZOLE SODIUM 20 MG PO TBEC: 20.0000 mg | DELAYED_RELEASE_TABLET | Freq: Every day | ORAL | 3 refills | Status: DC

## 2021-06-21 NOTE — Telephone Encounter (Signed)
Ok Rabeprazole Thx

## 2021-06-21 NOTE — Addendum Note (Signed)
Addended by: Tresa Garter on: 06/21/2021 10:20 AM   Modules accepted: Orders

## 2021-07-10 ENCOUNTER — Other Ambulatory Visit: Payer: Self-pay | Admitting: Obstetrics and Gynecology

## 2021-07-10 DIAGNOSIS — Z1231 Encounter for screening mammogram for malignant neoplasm of breast: Secondary | ICD-10-CM

## 2021-07-14 ENCOUNTER — Other Ambulatory Visit: Payer: Self-pay | Admitting: Internal Medicine

## 2021-07-30 ENCOUNTER — Ambulatory Visit: Payer: Managed Care, Other (non HMO) | Admitting: Internal Medicine

## 2021-08-14 ENCOUNTER — Other Ambulatory Visit: Payer: Self-pay

## 2021-08-14 ENCOUNTER — Ambulatory Visit
Admission: RE | Admit: 2021-08-14 | Discharge: 2021-08-14 | Disposition: A | Discharge status: Home / Self Care | Payer: Managed Care, Other (non HMO) | Source: Ambulatory Visit | Attending: Obstetrics and Gynecology | Admitting: Obstetrics and Gynecology

## 2021-08-14 DIAGNOSIS — Z1231 Encounter for screening mammogram for malignant neoplasm of breast: Secondary | ICD-10-CM

## 2021-08-28 ENCOUNTER — Other Ambulatory Visit: Payer: Self-pay | Admitting: Internal Medicine

## 2021-09-04 ENCOUNTER — Telehealth: Payer: Self-pay | Admitting: Internal Medicine

## 2021-09-04 DIAGNOSIS — R42 Dizziness and giddiness: Secondary | ICD-10-CM

## 2021-09-04 DIAGNOSIS — Z0000 Encounter for general adult medical examination without abnormal findings: Secondary | ICD-10-CM

## 2021-09-04 NOTE — Telephone Encounter (Signed)
Patient requesting order for labs 2 wks prior to cpe appt on 10-04-2021

## 2021-09-04 NOTE — Telephone Encounter (Signed)
Called pt there was no pt labs has been ordered. Can go to the [facility] to have done...[name]

## 2021-09-14 ENCOUNTER — Other Ambulatory Visit (INDEPENDENT_AMBULATORY_CARE_PROVIDER_SITE_OTHER): Payer: Managed Care, Other (non HMO)

## 2021-09-14 ENCOUNTER — Other Ambulatory Visit: Payer: Self-pay

## 2021-09-14 DIAGNOSIS — Z0000 Encounter for general adult medical examination without abnormal findings: Secondary | ICD-10-CM

## 2021-09-14 DIAGNOSIS — R42 Dizziness and giddiness: Secondary | ICD-10-CM

## 2021-09-14 LAB — CBC WITH DIFFERENTIAL/PLATELET
Basophils Absolute: 0 10*3/uL (ref 0.0–0.1)
Basophils Relative: 0.3 % (ref 0.0–3.0)
Eosinophils Absolute: 0.1 10*3/uL (ref 0.0–0.7)
Eosinophils Relative: 1.3 % (ref 0.0–5.0)
HCT: 36.9 % (ref 36.0–46.0)
Hemoglobin: 12 g/dL (ref 12.0–15.0)
Lymphocytes Relative: 41.8 % (ref 12.0–46.0)
Lymphs Abs: 2.9 10*3/uL (ref 0.7–4.0)
MCHC: 32.6 g/dL (ref 30.0–36.0)
MCV: 84.7 fl (ref 78.0–100.0)
Monocytes Absolute: 0.4 10*3/uL (ref 0.1–1.0)
Monocytes Relative: 6 % (ref 3.0–12.0)
Neutro Abs: 3.5 10*3/uL (ref 1.4–7.7)
Neutrophils Relative %: 50.6 % (ref 43.0–77.0)
Platelets: 341 10*3/uL (ref 150.0–400.0)
RBC: 4.36 Mil/uL (ref 3.87–5.11)
RDW: 13.7 % (ref 11.5–15.5)
WBC: 6.9 10*3/uL (ref 4.0–10.5)

## 2021-09-14 LAB — COMPREHENSIVE METABOLIC PANEL
ALT: 10 U/L (ref 0–35)
AST: 13 U/L (ref 0–37)
Albumin: 4.3 g/dL (ref 3.5–5.2)
Alkaline Phosphatase: 86 U/L (ref 39–117)
BUN: 9 mg/dL (ref 6–23)
CO2: 29 mEq/L (ref 19–32)
Calcium: 10 mg/dL (ref 8.4–10.5)
Chloride: 101 mEq/L (ref 96–112)
Creatinine, Ser: 0.75 mg/dL (ref 0.40–1.20)
GFR: 91.55 mL/min (ref >60.00)
Glucose, Bld: 86 mg/dL (ref 70–99)
Potassium: 3.8 mEq/L (ref 3.5–5.1)
Sodium: 137 mEq/L (ref 135–145)
Total Bilirubin: 0.4 mg/dL (ref 0.2–1.2)
Total Protein: 7.1 g/dL (ref 6.0–8.3)

## 2021-09-14 LAB — URINALYSIS, ROUTINE W REFLEX MICROSCOPIC
Bilirubin Urine: NEGATIVE
Hgb urine dipstick: NEGATIVE
Ketones, ur: NEGATIVE
Leukocytes,Ua: NEGATIVE
Nitrite: NEGATIVE
RBC / HPF: NONE SEEN (ref 0–?)
Specific Gravity, Urine: 1.015 (ref 1.000–1.030)
Total Protein, Urine: NEGATIVE
Urine Glucose: NEGATIVE
Urobilinogen, UA: 0.2 (ref 0.0–1.0)
pH: 7 (ref 5.0–8.0)

## 2021-09-14 LAB — LIPID PANEL
Cholesterol: 200 mg/dL (ref 0–200)
HDL: 62.7 mg/dL (ref >39.00)
LDL Cholesterol: 121 mg/dL — ABNORMAL HIGH (ref 0–99)
NonHDL: 137.12
Total CHOL/HDL Ratio: 3
Triglycerides: 82 mg/dL (ref 0.0–149.0)
VLDL: 16.4 mg/dL (ref 0.0–40.0)

## 2021-09-14 LAB — VITAMIN B12: Vitamin B-12: 1074 pg/mL — ABNORMAL HIGH (ref 211–911)

## 2021-09-14 LAB — TSH: TSH: 1.45 u[IU]/mL (ref 0.35–5.50)

## 2021-09-14 LAB — VITAMIN D 25 HYDROXY (VIT D DEFICIENCY, FRACTURES): VITD: 57.57 ng/mL (ref 30.00–100.00)

## 2021-10-02 ENCOUNTER — Other Ambulatory Visit: Payer: Self-pay | Admitting: Internal Medicine

## 2021-10-04 ENCOUNTER — Encounter: Payer: Managed Care, Other (non HMO) | Admitting: Internal Medicine

## 2021-10-30 DIAGNOSIS — E559 Vitamin D deficiency, unspecified: Secondary | ICD-10-CM | POA: Insufficient documentation

## 2021-10-30 DIAGNOSIS — N920 Excessive and frequent menstruation with regular cycle: Secondary | ICD-10-CM | POA: Insufficient documentation

## 2021-10-30 DIAGNOSIS — E785 Hyperlipidemia, unspecified: Secondary | ICD-10-CM | POA: Insufficient documentation

## 2021-11-14 ENCOUNTER — Other Ambulatory Visit: Payer: Self-pay | Admitting: Internal Medicine

## 2021-11-15 ENCOUNTER — Encounter: Payer: Managed Care, Other (non HMO) | Admitting: Internal Medicine

## 2021-11-30 ENCOUNTER — Other Ambulatory Visit: Payer: Self-pay

## 2021-11-30 ENCOUNTER — Ambulatory Visit (INDEPENDENT_AMBULATORY_CARE_PROVIDER_SITE_OTHER): Payer: Managed Care, Other (non HMO) | Admitting: Internal Medicine

## 2021-11-30 ENCOUNTER — Encounter: Payer: Self-pay | Admitting: Internal Medicine

## 2021-11-30 VITALS — BP 110/80 | HR 87 | Temp 98.3°F | Ht 67.0 in | Wt 157.0 lb

## 2021-11-30 DIAGNOSIS — K5904 Chronic idiopathic constipation: Secondary | ICD-10-CM

## 2021-11-30 DIAGNOSIS — F329 Major depressive disorder, single episode, unspecified: Secondary | ICD-10-CM

## 2021-11-30 DIAGNOSIS — Z1211 Encounter for screening for malignant neoplasm of colon: Secondary | ICD-10-CM | POA: Diagnosis not present

## 2021-11-30 DIAGNOSIS — F5102 Adjustment insomnia: Secondary | ICD-10-CM

## 2021-11-30 MED ORDER — RABEPRAZOLE SODIUM 20 MG PO TBEC: 20.0000 mg | DELAYED_RELEASE_TABLET | Freq: Every day | ORAL | 3 refills | Status: DC

## 2021-11-30 MED ORDER — DILTIAZEM HCL ER COATED BEADS 120 MG PO CP24: 120.0000 mg | ORAL_CAPSULE | Freq: Every day | ORAL | 3 refills | Status: DC

## 2021-11-30 MED ORDER — DULCOLAX 5 MG PO TBEC
5.0000 mg | DELAYED_RELEASE_TABLET | Freq: Two times a day (BID) | ORAL | 5 refills | Status: DC | PRN
Start: 2021-11-30 — End: 2022-07-25

## 2021-11-30 MED ORDER — METAMUCIL SMOOTH TEXTURE 58.6 % PO POWD: 1.0000 | Freq: Three times a day (TID) | ORAL | 12 refills | Status: DC

## 2021-11-30 MED ORDER — AMITRIPTYLINE HCL 25 MG PO TABS: 25.0000 mg | ORAL_TABLET | Freq: Every day | ORAL | 3 refills | Status: DC

## 2021-11-30 NOTE — Assessment & Plan Note (Signed)
Linzess po - it stopped working after a while. OK to d/c. Start Metamucil, Dulcolax

## 2021-11-30 NOTE — Progress Notes (Signed)
Subjective:  Patient ID: [name], female    DOB: 1971/09/26  Age: 51 y.o. MRN: [id number]  CC: No chief complaint on file.   HPI [name] presents for LBP, constipation, HTN. On diet.  Outpatient Medications Prior to Visit  Medication Sig Dispense Refill   ALPRAZolam (XANAX) 1 MG tablet TAKE 1/2 TABLET BY MOUTH TWICE A DAY AS NEEDED FOR ANXIETY DONT TAKE WITH LUNESTA 30 tablet 2   aspirin 81 MG tablet Take 81 mg by mouth daily.     [facility] D3 50 MCG (2000 UT) CAPS TAKE ONE CAPSULE BY MOUTH EVERY DAY 100 capsule 3   Eszopiclone 3 MG TABS TAKE 1 TABLET (3 MG TOTAL) BY MOUTH AT BEDTIME AS NEEDED. TAKE IMMEDIATELY BEFORE BEDTIME 30 tablet 3   fish oil-omega-3 fatty acids 1000 MG capsule Take 1 g by mouth daily.     spironolactone (ALDACTONE) 25 MG tablet TAKE 1 TABLET BY MOUTH  DAILY 90 tablet 3   triamcinolone cream (KENALOG) 0.1 % Apply 1 application topically 2 (two) times daily. 15 g 0   amitriptyline (ELAVIL) 25 MG tablet TAKE 1 TO 2 TABLETS BY MOUTH AT BEDTIME 180 tablet 2   diltiazem (CARDIZEM CD) 180 MG 24 hr capsule TAKE 1 CAPSULE BY MOUTH  DAILY 90 capsule 3   famotidine (PEPCID) 20 MG tablet Take 1 tablet (20 mg total) by mouth at bedtime. 90 tablet 3   potassium chloride (KLOR-CON) 10 MEQ tablet Take 1 tablet (10 mEq total) by mouth daily. 14 tablet 0   RABEprazole (ACIPHEX) 20 MG tablet Take 1 tablet (20 mg total) by mouth daily. 90 tablet 3   venlafaxine XR (EFFEXOR XR) 37.5 MG 24 hr capsule Take 1 capsule (37.5 mg total) by mouth daily with breakfast. 90 capsule 3   LINZESS 290 MCG CAPS capsule TAKE 1 CAPSULE(290 MCG) BY MOUTH DAILY BEFORE BREAKFAST (Patient not taking: Reported on 11/30/2021) 90 capsule 3   No facility-administered medications prior to visit.    ROS: Review of Systems  Constitutional:  Negative for activity change, appetite change, chills, fatigue and unexpected weight change.  HENT:  Negative for congestion, mouth sores and sinus  pressure.   Eyes:  Negative for visual disturbance.  Respiratory:  Negative for cough and chest tightness.   Gastrointestinal:  Positive for constipation. Negative for abdominal pain and nausea.  Genitourinary:  Negative for difficulty urinating, frequency and vaginal pain.  Musculoskeletal:  Positive for back pain. Negative for gait problem.  Skin:  Negative for pallor and rash.  Neurological:  Negative for dizziness, tremors, weakness, numbness and headaches.  Psychiatric/Behavioral:  Positive for dysphoric mood. Negative for confusion, sleep disturbance and suicidal ideas. The patient is nervous/anxious.    Objective:  BP 110/80 (BP Location: Left Arm, Patient Position: Sitting, Cuff Size: Large)    Pulse 87    Temp 98.3 F (36.8 C) (Oral)    Ht 5\' 7"  (1.702 m)    Wt 175 lb (79.4 kg)    SpO2 98%    BMI 27.41 kg/m   BP Readings from Last 3 Encounters:  11/30/21 110/80  06/11/21 124/83  04/10/21 136/90    Wt Readings from Last 3 Encounters:  11/30/21 175 lb (79.4 kg)  09/27/20 170 lb (77.1 kg)  09/12/20 170 lb 6.4 oz (77.3 kg)    Physical Exam Constitutional:      General: She is not in acute distress.    Appearance: She is well-developed.  HENT:  Head: Normocephalic.     Right Ear: External ear normal.     Left Ear: External ear normal.     Nose: Nose normal.  Eyes:     General:        Right eye: No discharge.        Left eye: No discharge.     Conjunctiva/sclera: Conjunctivae normal.     Pupils: Pupils are equal, round, and reactive to light.  Neck:     Thyroid: No thyromegaly.     Vascular: No JVD.     Trachea: No tracheal deviation.  Cardiovascular:     Rate and Rhythm: Normal rate and regular rhythm.     Heart sounds: Normal heart sounds.  Pulmonary:     Effort: No respiratory distress.     Breath sounds: No stridor. No wheezing.  Abdominal:     General: Bowel sounds are normal. There is no distension.     Palpations: Abdomen is soft. There is no mass.      Tenderness: There is no abdominal tenderness. There is no guarding or rebound.  Musculoskeletal:        General: No tenderness.     Cervical back: Normal range of motion and neck supple. No rigidity.  Lymphadenopathy:     Cervical: No cervical adenopathy.  Skin:    Findings: No erythema or rash.  Neurological:     Cranial Nerves: No cranial nerve deficit.     Motor: No abnormal muscle tone.     Coordination: Coordination normal.     Deep Tendon Reflexes: Reflexes normal.  Psychiatric:        Behavior: Behavior normal.        Thought Content: Thought content normal.        Judgment: Judgment normal.    Lab Results  Component Value Date   WBC 6.9 09/14/2021   HGB 12.0 09/14/2021   HCT 36.9 09/14/2021   PLT 341.0 09/14/2021   GLUCOSE 86 09/14/2021   CHOL 200 09/14/2021   TRIG 82.0 09/14/2021   HDL 62.70 09/14/2021   LDLDIRECT 120.8 08/04/2013   LDLCALC 121 (H) 09/14/2021   ALT 10 09/14/2021   AST 13 09/14/2021   NA 137 09/14/2021   K 3.8 09/14/2021   CL 101 09/14/2021   CREATININE 0.75 09/14/2021   BUN 9 09/14/2021   CO2 29 09/14/2021   TSH 1.45 09/14/2021    MM 3D SCREEN BREAST BILATERAL  Result Date: 08/14/2021 CLINICAL DATA:  Screening. EXAM: DIGITAL SCREENING BILATERAL MAMMOGRAM WITH TOMOSYNTHESIS AND CAD TECHNIQUE: Bilateral screening digital craniocaudal and mediolateral oblique mammograms were obtained. Bilateral screening digital breast tomosynthesis was performed. The images were evaluated with computer-aided detection. COMPARISON:  Previous exam(s). ACR Breast Density Category b: There are scattered areas of fibroglandular density. FINDINGS: There are no findings suspicious for malignancy. IMPRESSION: No mammographic evidence of malignancy. A result letter of this screening mammogram will be mailed directly to the patient. RECOMMENDATION: Screening mammogram in one year. (Code:SM-B-01Y) BI-RADS CATEGORY  1: Negative. Electronically Signed   By: Baird Lyons M.D.    On: 08/14/2021 16:33    Assessment & Plan:   Problem List Items Addressed This Visit     Constipation     Linzess po - it stopped working after a while. OK to d/c. Start Metamucil, Dulcolax      Depression    Not taking Effexor - d/c'd      Relevant Medications   amitriptyline (ELAVIL) 25 MG tablet   Insomnia  lunesta prn  Potential benefits of a long term benzodiazepines  use as well as potential risks  and complications were explained to the patient and were aknowledged.      Other Visit Diagnoses     Colon cancer screening    -  Primary   Relevant Orders   Ambulatory referral to Gastroenterology         Meds ordered this encounter  Medications   psyllium (METAMUCIL SMOOTH TEXTURE) 58.6 % powder    Sig: Take 1 packet by mouth 3 (three) times daily.    Dispense:  283 g    Refill:  12   diltiazem (CARDIZEM CD) 120 MG 24 hr capsule    Sig: Take 1 capsule (120 mg total) by mouth daily.    Dispense:  90 capsule    Refill:  3   bisacodyl (DULCOLAX) 5 MG EC tablet    Sig: Take 1 tablet (5 mg total) by mouth 2 (two) times daily as needed for moderate constipation.    Dispense:  60 tablet    Refill:  5   amitriptyline (ELAVIL) 25 MG tablet    Sig: Take 1-2 tablets (25-50 mg total) by mouth at bedtime.    Dispense:  180 tablet    Refill:  3   RABEprazole (ACIPHEX) 20 MG tablet    Sig: Take 1 tablet (20 mg total) by mouth daily.    Dispense:  90 tablet    Refill:  3      Follow-up: Return in about 6 months (around 05/30/2022) for a follow-up visit.  Sonda Primes, MD

## 2021-11-30 NOTE — Assessment & Plan Note (Signed)
Not taking Effexor - d/c'd

## 2021-11-30 NOTE — Assessment & Plan Note (Signed)
lunesta prn  Potential benefits of a long term benzodiazepines  use as well as potential risks  and complications were explained to the patient and were aknowledged. 

## 2022-04-14 ENCOUNTER — Other Ambulatory Visit: Payer: Self-pay | Admitting: Internal Medicine

## 2022-04-30 ENCOUNTER — Other Ambulatory Visit: Payer: Self-pay | Admitting: Internal Medicine

## 2022-05-03 ENCOUNTER — Ambulatory Visit: Payer: Managed Care, Other (non HMO) | Admitting: Internal Medicine

## 2022-05-03 IMAGING — CT CT CERVICAL SPINE W/O CM
2 series · 10 of 14 positions shown, 12 images · non-contrast
Comparison: X-ray 10/17/2020, MRI 08/04/2019

CLINICAL DATA: Neck pain, weakness

EXAM:
CT CERVICAL SPINE WITHOUT CONTRAST
TECHNIQUE: Multidetector CT imaging of the cervical spine was performed without
intravenous contrast. Multiplanar CT image reconstructions were also
generated.

[Series 3: cspine soft · axial · 0.33mm/px · z∈[-230,-118]mm · 5 of 84 slices shown]
[im 14/84  soft-tissue]
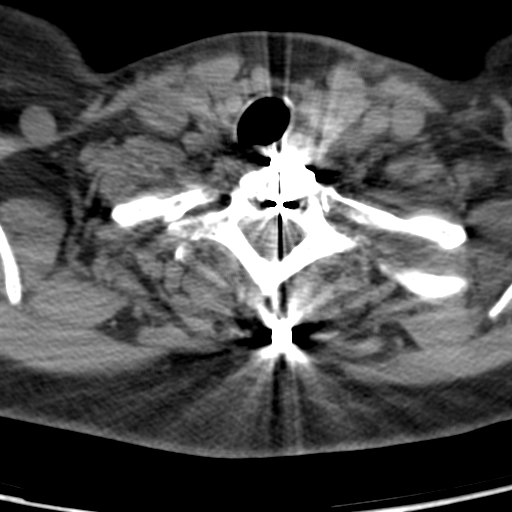
[im 28/84  soft-tissue]
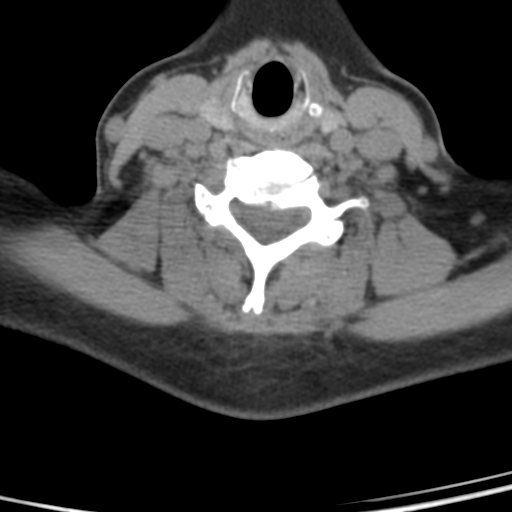
[im 42/84  soft-tissue]
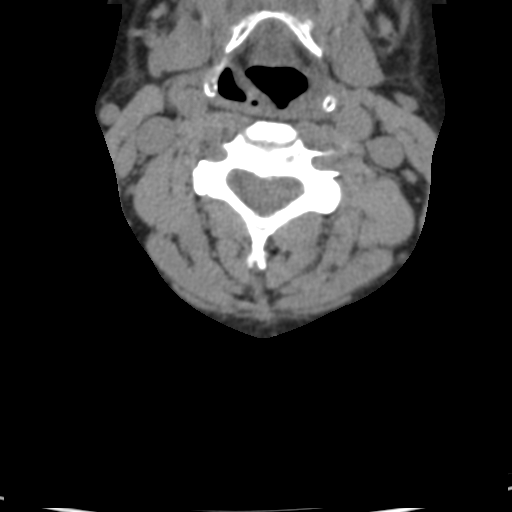
[im 56/84  soft-tissue]
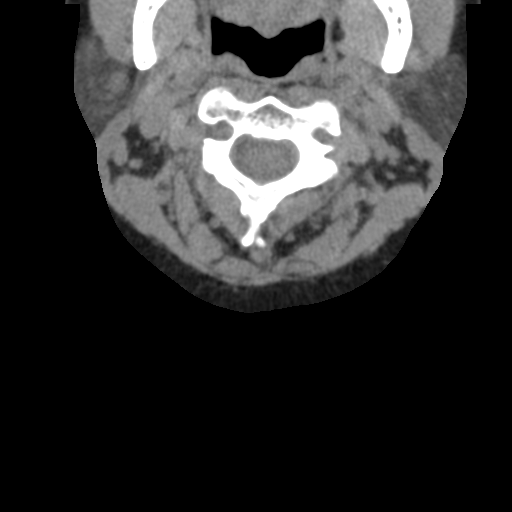
[im 70/84  soft-tissue]
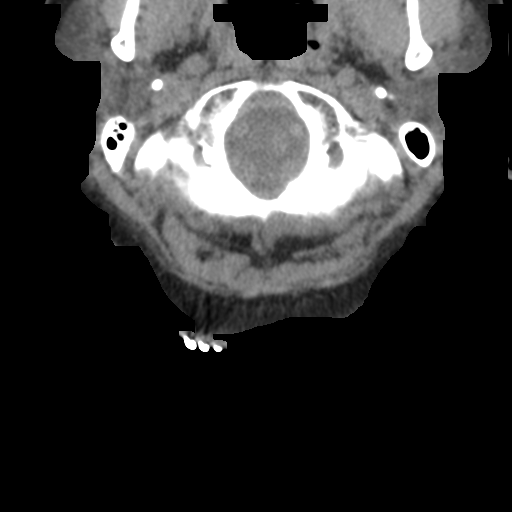

[Series 9: angled axial · axial · 0.29mm/px · z∈[-250,-141]mm · 5 of 84 slices shown, 7 images]
[im 14/84  soft-tissue]
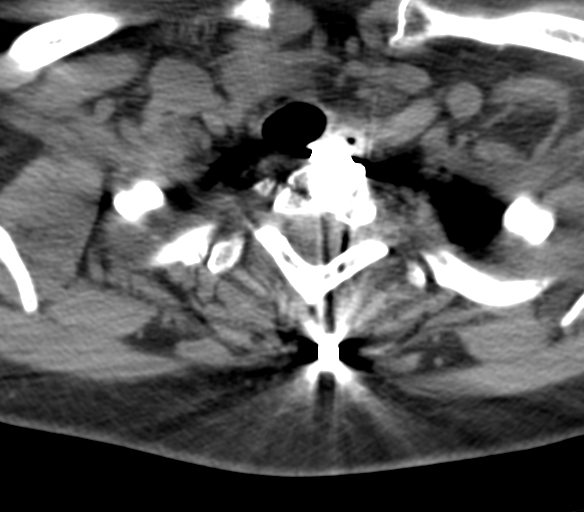
[im 14/84  bone]
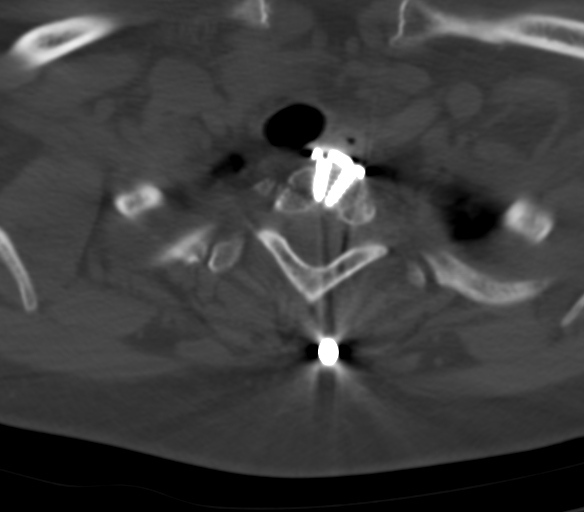
[im 28/84  bone]
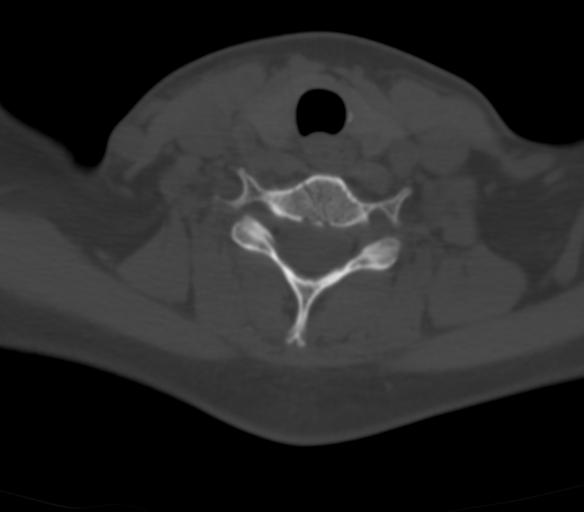
[im 42/84  bone]
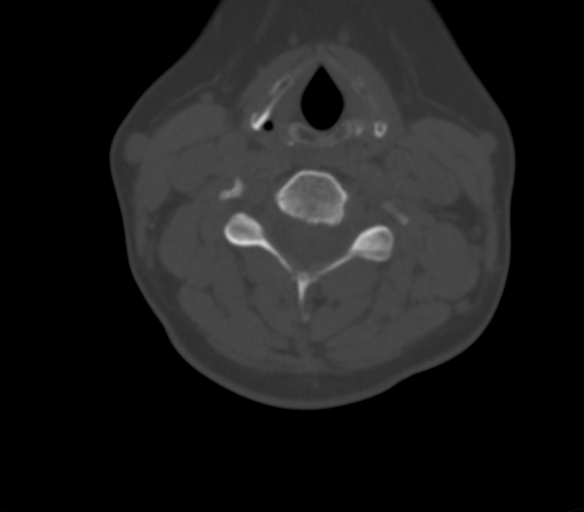
[im 56/84  bone]
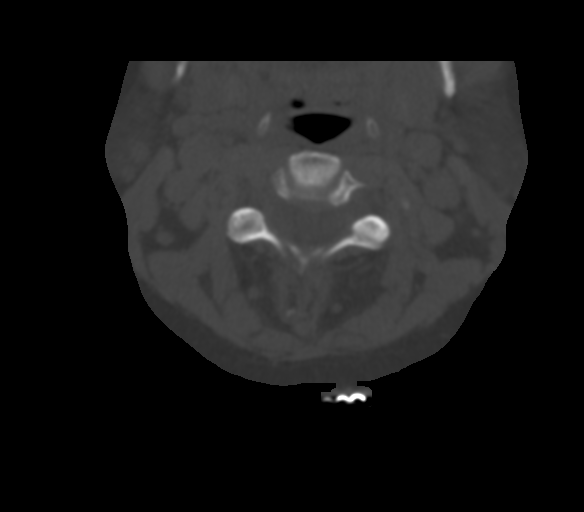
[im 70/84  soft-tissue]
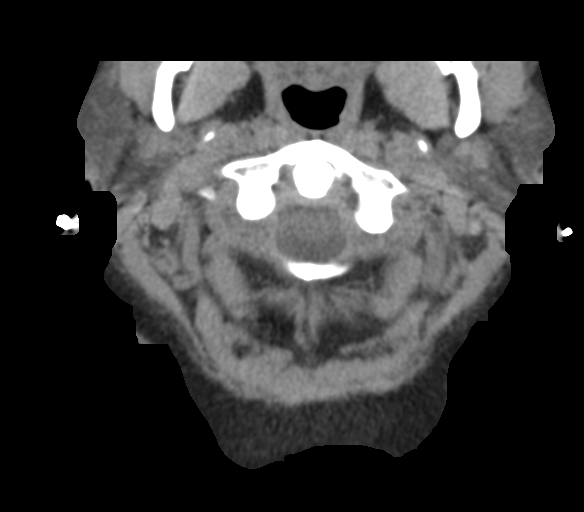
[im 70/84  bone]
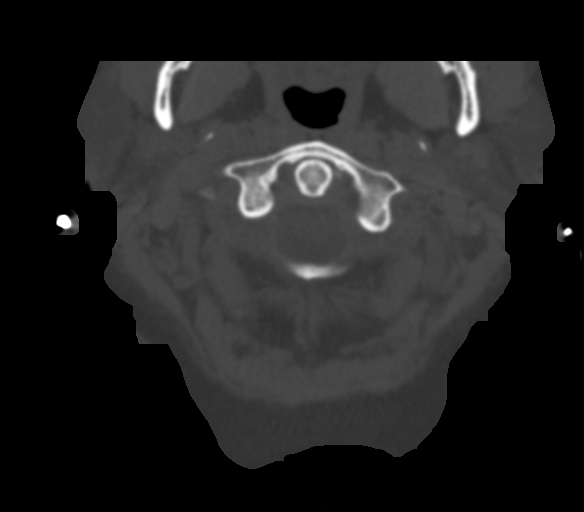

[10 of 14 positions shown; findings below may reference images not displayed]

FINDINGS: Alignment: Straightening with reversal of the cervical lordosis.
Facet joints are aligned without dislocation or traumatic listhesis.
Dens and lateral masses are aligned.

Skull base and vertebrae: C7-T1 ACDF with solid interbody fusion. No
acute fracture. No suspicious bone lesion. Partially visualized
posterior thoracic fixation hardware.

Soft tissues and spinal canal: No prevertebral fluid or swelling. No
visible canal hematoma.

Disc levels: Mild disc height loss of C4-5, C5-6, and C6-7 with
degenerative endplate spurring. No evidence of foraminal or canal
stenosis by CT at C2-3, C3-4, or C4-5. Suspect mild right foraminal
stenosis at C5-6 and C6-7. No evidence of canal stenosis by CT.

Upper chest: Included lung apices are clear.

Other: None.
IMPRESSION: 1. No acute fracture or subluxation of the cervical spine.
2. C7-T1 ACDF with solid interbody fusion.
3. Mild multilevel degenerative disc disease. Suspect mild right
foraminal stenosis at C5-6 and C6-7. No evidence of canal stenosis
by CT.

## 2022-06-20 ENCOUNTER — Other Ambulatory Visit: Payer: Self-pay | Admitting: Internal Medicine

## 2022-06-20 NOTE — Telephone Encounter (Signed)
Check [facility] last filled 03/17/2022. MD out of the office pls advise on refill.Marland KitchenRaechel Chute

## 2022-07-13 ENCOUNTER — Other Ambulatory Visit: Payer: Self-pay | Admitting: Internal Medicine

## 2022-07-20 DIAGNOSIS — R079 Chest pain, unspecified: Secondary | ICD-10-CM | POA: Insufficient documentation

## 2022-07-25 ENCOUNTER — Ambulatory Visit: Payer: Managed Care, Other (non HMO) | Admitting: Internal Medicine

## 2022-07-25 ENCOUNTER — Encounter: Payer: Self-pay | Admitting: Internal Medicine

## 2022-07-25 ENCOUNTER — Ambulatory Visit: Payer: Managed Care, Other (non HMO)

## 2022-07-25 DIAGNOSIS — S060XAA Concussion with loss of consciousness status unknown, initial encounter: Secondary | ICD-10-CM | POA: Insufficient documentation

## 2022-07-25 DIAGNOSIS — M5440 Lumbago with sciatica, unspecified side: Secondary | ICD-10-CM | POA: Diagnosis not present

## 2022-07-25 DIAGNOSIS — S20219A Contusion of unspecified front wall of thorax, initial encounter: Secondary | ICD-10-CM | POA: Diagnosis not present

## 2022-07-25 DIAGNOSIS — G8929 Other chronic pain: Secondary | ICD-10-CM | POA: Diagnosis not present

## 2022-07-25 DIAGNOSIS — R110 Nausea: Secondary | ICD-10-CM | POA: Diagnosis not present

## 2022-07-25 DIAGNOSIS — S060X1A Concussion with loss of consciousness of 30 minutes or less, initial encounter: Secondary | ICD-10-CM

## 2022-07-25 LAB — CBC WITH DIFFERENTIAL/PLATELET
Basophils Absolute: 0 10*3/uL (ref 0.0–0.1)
Basophils Relative: 0.6 % (ref 0.0–3.0)
Eosinophils Absolute: 0.1 10*3/uL (ref 0.0–0.7)
Eosinophils Relative: 1.4 % (ref 0.0–5.0)
HCT: 37.3 % (ref 36.0–46.0)
Hemoglobin: 12.3 g/dL (ref 12.0–15.0)
Lymphocytes Relative: 44 % (ref 12.0–46.0)
Lymphs Abs: 3.2 10*3/uL (ref 0.7–4.0)
MCHC: 33 g/dL (ref 30.0–36.0)
MCV: 84.2 fl (ref 78.0–100.0)
Monocytes Absolute: 0.3 10*3/uL (ref 0.1–1.0)
Monocytes Relative: 4.2 % (ref 3.0–12.0)
Neutro Abs: 3.6 10*3/uL (ref 1.4–7.7)
Neutrophils Relative %: 49.8 % (ref 43.0–77.0)
Platelets: 336 10*3/uL (ref 150.0–400.0)
RBC: 4.43 Mil/uL (ref 3.87–5.11)
RDW: 13.8 % (ref 11.5–15.5)
WBC: 7.2 10*3/uL (ref 4.0–10.5)

## 2022-07-25 LAB — URINALYSIS
Bilirubin Urine: NEGATIVE
Hgb urine dipstick: NEGATIVE
Ketones, ur: NEGATIVE
Leukocytes,Ua: NEGATIVE
Nitrite: NEGATIVE
Specific Gravity, Urine: 1.01 (ref 1.000–1.030)
Total Protein, Urine: NEGATIVE
Urine Glucose: NEGATIVE
Urobilinogen, UA: 0.2 (ref 0.0–1.0)
pH: 7 (ref 5.0–8.0)

## 2022-07-25 LAB — COMPREHENSIVE METABOLIC PANEL
ALT: 17 U/L (ref 0–35)
AST: 21 U/L (ref 0–37)
Albumin: 4.5 g/dL (ref 3.5–5.2)
Alkaline Phosphatase: 85 U/L (ref 39–117)
BUN: 9 mg/dL (ref 6–23)
CO2: 27 mEq/L (ref 19–32)
Calcium: 10.1 mg/dL (ref 8.4–10.5)
Chloride: 104 mEq/L (ref 96–112)
Creatinine, Ser: 0.69 mg/dL (ref 0.40–1.20)
GFR: 99.19 mL/min (ref >60.00)
Glucose, Bld: 90 mg/dL (ref 70–99)
Potassium: 3.5 mEq/L (ref 3.5–5.1)
Sodium: 140 mEq/L (ref 135–145)
Total Bilirubin: 0.3 mg/dL (ref 0.2–1.2)
Total Protein: 7.8 g/dL (ref 6.0–8.3)

## 2022-07-25 LAB — CK: Total CK: 73 U/L (ref 7–177)

## 2022-07-25 MED ORDER — ONDANSETRON HCL 4 MG/2ML IJ SOLN
4.0000 mg | Freq: Once | INTRAMUSCULAR | Status: AC
  Administered 2022-07-25: 4 mg via INTRAMUSCULAR

## 2022-07-25 MED ORDER — HYDROCODONE-ACETAMINOPHEN 7.5-325 MG PO TABS: 1.0000 | ORAL_TABLET | Freq: Four times a day (QID) | ORAL | 0 refills | Status: DC | PRN

## 2022-07-25 MED ORDER — METHOCARBAMOL 500 MG PO TABS: 500.0000 mg | ORAL_TABLET | Freq: Three times a day (TID) | ORAL | 1 refills | Status: DC | PRN

## 2022-07-25 MED ORDER — KETOROLAC TROMETHAMINE 60 MG/2ML IM SOLN
60.0000 mg | Freq: Once | INTRAMUSCULAR | Status: AC
  Administered 2022-07-25: 60 mg via INTRAMUSCULAR

## 2022-07-25 MED ORDER — ONDANSETRON HCL 4 MG PO TABS: 4.0000 mg | ORAL_TABLET | Freq: Three times a day (TID) | ORAL | 1 refills | Status: DC | PRN

## 2022-07-25 MED ORDER — CVS D3 50 MCG (2000 UT) PO CAPS: 1.0000 | ORAL_CAPSULE | Freq: Every day | ORAL | 3 refills | Status: DC
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
## 2022-07-26 ENCOUNTER — Telehealth: Payer: Self-pay | Admitting: Internal Medicine

## 2022-07-26 LAB — TROPONIN I: Troponin I: <3 ng/L (ref <=47)

## 2022-07-29 DIAGNOSIS — Z0289 Encounter for other administrative examinations: Secondary | ICD-10-CM

## 2022-07-29 NOTE — Telephone Encounter (Signed)
Called pt concerning Disability form that MD received. He did fill his portion, but there is a page that the pt has to sign. Per chart pt has appt tomorrow will hold until then.Marland KitchenJohny Chess

## 2022-07-30 ENCOUNTER — Encounter: Payer: Self-pay | Admitting: Internal Medicine

## 2022-07-30 ENCOUNTER — Ambulatory Visit (INDEPENDENT_AMBULATORY_CARE_PROVIDER_SITE_OTHER): Payer: Managed Care, Other (non HMO) | Admitting: Internal Medicine

## 2022-07-30 VITALS — BP 120/84 | HR 73 | Temp 98.7°F | Ht 67.0 in

## 2022-07-30 DIAGNOSIS — S060X1A Concussion with loss of consciousness of 30 minutes or less, initial encounter: Secondary | ICD-10-CM | POA: Diagnosis not present

## 2022-07-30 DIAGNOSIS — S20219D Contusion of unspecified front wall of thorax, subsequent encounter: Secondary | ICD-10-CM | POA: Diagnosis not present

## 2022-07-30 DIAGNOSIS — F419 Anxiety disorder, unspecified: Secondary | ICD-10-CM

## 2022-07-30 DIAGNOSIS — M5440 Lumbago with sciatica, unspecified side: Secondary | ICD-10-CM

## 2022-07-30 MED ORDER — HYDROCODONE-ACETAMINOPHEN 10-325 MG PO TABS: 1.0000 | ORAL_TABLET | Freq: Four times a day (QID) | ORAL | 0 refills | Status: DC | PRN
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
## 2022-07-30 NOTE — Patient Instructions (Signed)
Salonpas Pain Relieving Patch 

## 2022-07-31 ENCOUNTER — Other Ambulatory Visit: Payer: Self-pay | Admitting: Internal Medicine

## 2022-07-31 ENCOUNTER — Telehealth: Payer: Self-pay | Admitting: Internal Medicine
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
## 2022-08-01 NOTE — Telephone Encounter (Signed)
Spoke with [name] ([facility]) again and on learning that Dr.Plotnikov is needing this done within this week/early next week they can get this PT done as a walk in for their location. The only thing needed done on our end is the exam needs to  be marked as STAT within Epic.

## 2022-08-01 NOTE — Telephone Encounter (Signed)
Change order to STAT.[name]/LMB

## 2022-08-01 NOTE — Telephone Encounter (Signed)
We need to do the CT scan this week or early next week please.  Thank you

## 2022-08-06 ENCOUNTER — Telehealth: Payer: Self-pay | Admitting: Internal Medicine
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
## 2022-08-08 NOTE — Telephone Encounter (Signed)
Notified [facility] w/MD response.Marland KitchenJohny Fox
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
## 2022-08-11 NOTE — Assessment & Plan Note (Signed)
Refractory pain.  Obtain chest CT.   Continue with Norco, Robaxin, ice

## 2022-08-11 NOTE — Assessment & Plan Note (Addendum)
No improvement of pain in general.  Prescribed TENS unit Continue with ice Start physical therapy next week Chest CT was ordered
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
## 2022-08-11 NOTE — Assessment & Plan Note (Signed)
Persistent headaches.  Starting physical therapy next week

## 2022-08-11 NOTE — Assessment & Plan Note (Signed)
Refractory pain.  Obtain chest CT.  Rule out repeat sternum fracture Continue with Norco, Robaxin, ice

## 2022-08-12 NOTE — Telephone Encounter (Signed)
Place form on MD desk../lmb 

## 2022-08-13 ENCOUNTER — Encounter: Payer: Self-pay | Admitting: Internal Medicine

## 2022-08-13 ENCOUNTER — Ambulatory Visit (INDEPENDENT_AMBULATORY_CARE_PROVIDER_SITE_OTHER): Payer: Managed Care, Other (non HMO) | Admitting: Internal Medicine

## 2022-08-13 DIAGNOSIS — S060X1A Concussion with loss of consciousness of 30 minutes or less, initial encounter: Secondary | ICD-10-CM

## 2022-08-13 DIAGNOSIS — F439 Reaction to severe stress, unspecified: Secondary | ICD-10-CM | POA: Diagnosis not present

## 2022-08-13 MED ORDER — DICLOFENAC SODIUM 75 MG PO TBEC: 75.0000 mg | DELAYED_RELEASE_TABLET | Freq: Two times a day (BID) | ORAL | 1 refills | Status: DC

## 2022-08-13 MED ORDER — BUTALBITAL-APAP-CAFFEINE 50-325-40 MG PO TABS: 1.0000 | ORAL_TABLET | Freq: Four times a day (QID) | ORAL | 1 refills | Status: DC | PRN
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
## 2022-08-13 NOTE — Assessment & Plan Note (Signed)
Stay off work 

## 2022-08-13 NOTE — Patient Instructions (Signed)
Stay off work for another 2 weeks or longer
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
## 2022-08-15 NOTE — Telephone Encounter (Signed)
New form w/start date fax to [phone].Marland KitchenJohny Fox

## 2022-08-19 ENCOUNTER — Other Ambulatory Visit: Payer: Self-pay | Admitting: Obstetrics and Gynecology

## 2022-08-19 DIAGNOSIS — Z1231 Encounter for screening mammogram for malignant neoplasm of breast: Secondary | ICD-10-CM

## 2022-08-20 ENCOUNTER — Ambulatory Visit
Admission: RE | Admit: 2022-08-20 | Discharge: 2022-08-20 | Disposition: A | Discharge status: Home / Self Care | Payer: Managed Care, Other (non HMO) | Source: Ambulatory Visit | Attending: Internal Medicine | Admitting: Internal Medicine

## 2022-08-20 DIAGNOSIS — S060X1A Concussion with loss of consciousness of 30 minutes or less, initial encounter: Secondary | ICD-10-CM

## 2022-08-20 MED ORDER — GADOPICLENOL 0.5 MMOL/ML IV SOLN
7.5000 mL | Freq: Once | INTRAVENOUS | Status: AC | PRN
  Administered 2022-08-20: 7.5 mL via INTRAVENOUS

## 2022-08-27 ENCOUNTER — Ambulatory Visit (INDEPENDENT_AMBULATORY_CARE_PROVIDER_SITE_OTHER): Payer: Managed Care, Other (non HMO) | Admitting: Internal Medicine

## 2022-08-27 ENCOUNTER — Ambulatory Visit: Payer: Managed Care, Other (non HMO) | Admitting: Internal Medicine

## 2022-08-27 DIAGNOSIS — G4489 Other headache syndrome: Secondary | ICD-10-CM | POA: Diagnosis not present

## 2022-08-27 DIAGNOSIS — M542 Cervicalgia: Secondary | ICD-10-CM | POA: Diagnosis not present

## 2022-08-27 DIAGNOSIS — M5440 Lumbago with sciatica, unspecified side: Secondary | ICD-10-CM

## 2022-08-27 DIAGNOSIS — M4302 Spondylolysis, cervical region: Secondary | ICD-10-CM | POA: Insufficient documentation

## 2022-08-27 DIAGNOSIS — N760 Acute vaginitis: Secondary | ICD-10-CM | POA: Insufficient documentation

## 2022-08-27 DIAGNOSIS — G43911 Migraine, unspecified, intractable, with status migrainosus: Secondary | ICD-10-CM

## 2022-08-27 MED ORDER — ALPRAZOLAM 1 MG PO TABS: ORAL_TABLET | ORAL | 0 refills | Status: DC

## 2022-08-27 MED ORDER — ESZOPICLONE 3 MG PO TABS: 3.0000 mg | ORAL_TABLET | Freq: Every evening | ORAL | 3 refills | Status: DC | PRN

## 2022-08-27 MED ORDER — RABEPRAZOLE SODIUM 20 MG PO TBEC: 20.0000 mg | DELAYED_RELEASE_TABLET | Freq: Every day | ORAL | 0 refills | Status: DC

## 2022-08-27 NOTE — Assessment & Plan Note (Signed)
In PT 
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
## 2022-08-27 NOTE — Patient Instructions (Addendum)
Try Excedrin for headaches, salonpas on neck

## 2022-08-27 NOTE — Assessment & Plan Note (Signed)
Cont w/PT 

## 2022-08-27 NOTE — Assessment & Plan Note (Signed)
Refractory HA - post-concussion Will ref to [facility] Clinic

## 2022-08-28 ENCOUNTER — Telehealth: Payer: Self-pay | Admitting: Internal Medicine

## 2022-08-28 NOTE — Telephone Encounter (Signed)
Patient is calling to check on the status of her disability forms that were left.  Please advise.

## 2022-08-29 NOTE — Telephone Encounter (Signed)
MD signed form from [employer] faxed back to [phone] received confirmation it was received. Also notified pt form was faxed.Marland KitchenRaechel Chute

## 2022-08-29 NOTE — Telephone Encounter (Signed)
Called pt back inform pt forms has not been completed yet, but gave MD a msg. Once fax I will call her to let her know.Marland KitchenRaechel Chute

## 2022-08-29 NOTE — Telephone Encounter (Signed)
Patient called back and said that she really needs this form done today or tomorrow.  She would like for Lucy to give her a call today and let her know what the progress is on this form.,

## 2022-08-30 NOTE — Telephone Encounter (Signed)
Signed. Thank you.

## 2022-09-17 ENCOUNTER — Encounter: Payer: Self-pay | Admitting: Internal Medicine

## 2022-09-17 ENCOUNTER — Ambulatory Visit (INDEPENDENT_AMBULATORY_CARE_PROVIDER_SITE_OTHER): Payer: Managed Care, Other (non HMO) | Admitting: Internal Medicine

## 2022-09-17 VITALS — BP 122/78 | HR 85 | Temp 98.1°F | Ht 67.0 in | Wt 161.0 lb

## 2022-09-17 DIAGNOSIS — S060X1A Concussion with loss of consciousness of 30 minutes or less, initial encounter: Secondary | ICD-10-CM

## 2022-09-17 DIAGNOSIS — G43911 Migraine, unspecified, intractable, with status migrainosus: Secondary | ICD-10-CM | POA: Diagnosis not present

## 2022-09-17 DIAGNOSIS — M5440 Lumbago with sciatica, unspecified side: Secondary | ICD-10-CM

## 2022-09-17 NOTE — Assessment & Plan Note (Signed)
Post-concussion HAs

## 2022-09-17 NOTE — Assessment & Plan Note (Addendum)
Finished PT Refractory HA - post-concussion; getting better To work on Mon - 2d/wk or 8 d/mo off if needed
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
## 2022-09-17 NOTE — Assessment & Plan Note (Signed)
Refractory HA - post-concussion HA

## 2022-09-17 NOTE — Assessment & Plan Note (Signed)
Finished PT 

## 2022-09-18 ENCOUNTER — Telehealth: Payer: Self-pay | Admitting: *Deleted

## 2022-09-18 NOTE — Telephone Encounter (Signed)
Rec'd FMLA forms place on MD desk for completion.Marland KitchenRaechel Fox

## 2022-09-23 DIAGNOSIS — Z0289 Encounter for other administrative examinations: Secondary | ICD-10-CM

## 2022-09-23 NOTE — Telephone Encounter (Signed)
Done. Thx.

## 2022-09-23 NOTE — Telephone Encounter (Signed)
Patient is requesting a call back about the FMLA paperwork at [phone]

## 2022-09-23 NOTE — Telephone Encounter (Signed)
Called pt back inform her MD received forms Thurs, He is still filling FMLA there's like 10 pages. Will call her when forms are faxed.Marland KitchenShearon Stalls

## 2022-09-23 NOTE — Telephone Encounter (Signed)
Notified pt faxed forms back to [phone].Left original up front for her to pick-up...[name]

## 2022-10-16 ENCOUNTER — Telehealth: Payer: Self-pay | Admitting: Internal Medicine

## 2022-10-16 NOTE — Telephone Encounter (Signed)
Patient called concerning FMLA papers that are going to be sent to Dr. Camila Li - She wants you to call her so she can explain exactly what needs to be done to make them right.  Patient #  [phone]

## 2022-10-16 NOTE — Telephone Encounter (Signed)
Called pt concerning msg below. She states MD has to be direct on how many days. She states for the month she told him 8. Inform pt FMLA form hasn't came in yet, but will call her back when received so we can fill out correctly.Marland KitchenJohny Chess

## 2022-10-17 NOTE — Telephone Encounter (Signed)
Notified pt we received form this am once has been completed will call her back wit update.Place form on MD purple folder...[name]

## 2022-10-17 NOTE — Telephone Encounter (Signed)
MD completed FMLA notified pt via mychart forms has been fax to [employer] @ [phone]./lmb

## 2022-10-18 ENCOUNTER — Ambulatory Visit
Admission: RE | Admit: 2022-10-18 | Discharge: 2022-10-18 | Disposition: A | Discharge status: Home / Self Care | Payer: Managed Care, Other (non HMO) | Source: Ambulatory Visit | Attending: Obstetrics and Gynecology | Admitting: Obstetrics and Gynecology

## 2022-10-18 DIAGNOSIS — Z1231 Encounter for screening mammogram for malignant neoplasm of breast: Secondary | ICD-10-CM

## 2022-10-22 NOTE — Telephone Encounter (Signed)
Please call patient back in relation to this form.

## 2022-10-22 NOTE — Telephone Encounter (Signed)
Called pt she states she received a email stating MD need to complete sec C on page 4. Inform pt there is no question on page 4. Sheet is blank. Pt is emailing me a copy of her email. Inform pt when I receive will call her back and complete form together.Marland KitchenJohny Chess

## 2022-10-22 NOTE — Telephone Encounter (Signed)
MD signed for faxed back to [employer] @ [phone]./lmb .Marland KitchenJohny Chess

## 2022-10-25 ENCOUNTER — Telehealth: Payer: Self-pay | Admitting: Internal Medicine

## 2022-10-25 MED ORDER — SPIRONOLACTONE 25 MG PO TABS: 25.0000 mg | ORAL_TABLET | Freq: Every day | ORAL | 1 refills | Status: DC

## 2022-10-25 MED ORDER — DILTIAZEM HCL ER COATED BEADS 120 MG PO CP24: 120.0000 mg | ORAL_CAPSULE | Freq: Every day | ORAL | 1 refills | Status: DC

## 2022-10-25 NOTE — Addendum Note (Signed)
Addended by: Earnstine Regal on: 10/25/2022 08:48 AM   Modules accepted: Orders

## 2022-10-25 NOTE — Telephone Encounter (Signed)
Patient called to follow up on diltiazem (CARDIZEM CD) 120 MG 24 hr capsule and asked that it please be sent to [facility] on [address] this time.   she also needs a refill on  spironolactone (ALDACTONE) 25 MG tablet  2.  ALPRAZolam (XANAX) 1 MG tablet

## 2022-10-25 NOTE — Telephone Encounter (Signed)
Sent refill on maintenance meds pls advise on xanax.Marland KitchenJohny Chess

## 2022-10-27 MED ORDER — ALPRAZOLAM 1 MG PO TABS: ORAL_TABLET | ORAL | 1 refills | Status: DC

## 2022-10-27 MED ORDER — SPIRONOLACTONE 25 MG PO TABS: 25.0000 mg | ORAL_TABLET | Freq: Every day | ORAL | 3 refills | Status: DC

## 2022-10-27 MED ORDER — DILTIAZEM HCL ER COATED BEADS 120 MG PO CP24: 120.0000 mg | ORAL_CAPSULE | Freq: Every day | ORAL | 3 refills | Status: DC

## 2022-10-27 NOTE — Addendum Note (Signed)
Addended by: Cassandria Anger on: 10/27/2022 08:53 PM   Modules accepted: Orders

## 2022-10-27 NOTE — Telephone Encounter (Signed)
Okay.  Thanks.

## 2022-10-30 ENCOUNTER — Telehealth: Payer: Self-pay | Admitting: Internal Medicine

## 2022-10-30 NOTE — Telephone Encounter (Signed)
MEDICATION: Hydrocodone, She said it was last presribed in October of 2023. She said the mg it was prescribed for they dont make anymore  PH[facility] [address]   Comments:   **Let patient know to contact pharmacy at the end of the day to make sure medication is ready. **  ** Please notify patient to allow 48-72 hours to process**  **Encourage patient to contact the pharmacy for refills or they can request refills through [facility]**

## 2022-10-31 MED ORDER — HYDROCODONE-ACETAMINOPHEN 10-325 MG PO TABS: 1.0000 | ORAL_TABLET | Freq: Four times a day (QID) | ORAL | 0 refills | Status: DC | PRN

## 2022-10-31 NOTE — Telephone Encounter (Signed)
Okay.  Keep return office visit.  Thank you

## 2022-11-19 ENCOUNTER — Encounter: Payer: Self-pay | Admitting: Internal Medicine

## 2022-11-19 ENCOUNTER — Ambulatory Visit (INDEPENDENT_AMBULATORY_CARE_PROVIDER_SITE_OTHER): Payer: Managed Care, Other (non HMO) | Admitting: Internal Medicine

## 2022-11-19 VITALS — Ht 67.0 in

## 2022-11-19 DIAGNOSIS — K219 Gastro-esophageal reflux disease without esophagitis: Secondary | ICD-10-CM

## 2022-11-19 DIAGNOSIS — G43911 Migraine, unspecified, intractable, with status migrainosus: Secondary | ICD-10-CM

## 2022-11-19 DIAGNOSIS — F5102 Adjustment insomnia: Secondary | ICD-10-CM

## 2022-11-19 DIAGNOSIS — M5440 Lumbago with sciatica, unspecified side: Secondary | ICD-10-CM

## 2022-11-19 DIAGNOSIS — F329 Major depressive disorder, single episode, unspecified: Secondary | ICD-10-CM

## 2022-11-19 DIAGNOSIS — M542 Cervicalgia: Secondary | ICD-10-CM

## 2022-11-19 DIAGNOSIS — S060X1S Concussion with loss of consciousness of 30 minutes or less, sequela: Secondary | ICD-10-CM

## 2022-11-19 DIAGNOSIS — E559 Vitamin D deficiency, unspecified: Secondary | ICD-10-CM

## 2022-11-19 DIAGNOSIS — S060X1D Concussion with loss of consciousness of 30 minutes or less, subsequent encounter: Secondary | ICD-10-CM

## 2022-11-19 MED ORDER — RABEPRAZOLE SODIUM 20 MG PO TBEC: 20.0000 mg | DELAYED_RELEASE_TABLET | Freq: Every day | ORAL | 3 refills | Status: DC
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
## 2022-11-19 NOTE — Assessment & Plan Note (Signed)
Lexapro 

## 2022-11-19 NOTE — Assessment & Plan Note (Signed)
S/p neck fusion surgery

## 2022-11-19 NOTE — Assessment & Plan Note (Signed)
On prn Norco, Robaxin  Potential benefits of a long term opioids use as well as potential risks (i.e. addiction risk, apnea etc) and complications (i.e. Somnolence, constipation and others) were explained to the patient and were aknowledged. Finished physical therapy  PMR ref

## 2022-11-19 NOTE — Assessment & Plan Note (Signed)
Not better Cont on Rx
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
## 2022-11-19 NOTE — Assessment & Plan Note (Signed)
Worse: re-start Aciphex
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
## 2022-11-19 NOTE — Assessment & Plan Note (Signed)
Treat GERD - re-start Aciphex

## 2022-11-19 NOTE — Assessment & Plan Note (Signed)
On Vit D 

## 2022-11-21 ENCOUNTER — Telehealth: Payer: Self-pay | Admitting: Internal Medicine

## 2022-11-21 MED ORDER — SPIRONOLACTONE 25 MG PO TABS: 25.0000 mg | ORAL_TABLET | Freq: Every day | ORAL | 3 refills | Status: DC

## 2022-11-21 MED ORDER — DILTIAZEM HCL ER COATED BEADS 120 MG PO CP24: 120.0000 mg | ORAL_CAPSULE | Freq: Every day | ORAL | 3 refills | Status: DC

## 2022-11-21 NOTE — Telephone Encounter (Signed)
Sent maintenance meds pls respond to alprazolam and Lunesta..[name]

## 2022-11-21 NOTE — Telephone Encounter (Signed)
Patient called and said she is unable to use [facility] anymore. She would like her medication refills sent to [facility], which is a mail order pharmacy. Their number is [phone]. The medications she would like sent there are listed below. Best callback for patient is [phone].   ALPRAZolam (XANAX) 1 MG tablet  diltiazem (CARDIZEM CD) 120 MG 24 hr capsule   Eszopiclone 3 MG TABS  spironolactone (ALDACTONE) 25 MG tablet

## 2022-11-22 MED ORDER — ALPRAZOLAM 1 MG PO TABS: ORAL_TABLET | ORAL | 1 refills | Status: DC

## 2022-11-22 MED ORDER — DILTIAZEM HCL ER COATED BEADS 120 MG PO CP24: 120.0000 mg | ORAL_CAPSULE | Freq: Every day | ORAL | 3 refills | Status: DC

## 2022-11-22 MED ORDER — ESZOPICLONE 3 MG PO TABS: 3.0000 mg | ORAL_TABLET | Freq: Every evening | ORAL | 0 refills | Status: DC | PRN

## 2022-11-22 NOTE — Telephone Encounter (Signed)
Okay.  Thanks.

## 2022-12-14 ENCOUNTER — Other Ambulatory Visit: Payer: Self-pay | Admitting: Internal Medicine

## 2023-01-20 ENCOUNTER — Encounter: Payer: Self-pay | Admitting: Physical Medicine and Rehabilitation

## 2023-01-22 ENCOUNTER — Ambulatory Visit (INDEPENDENT_AMBULATORY_CARE_PROVIDER_SITE_OTHER): Payer: Managed Care, Other (non HMO) | Admitting: Internal Medicine

## 2023-01-22 ENCOUNTER — Encounter: Payer: Self-pay | Admitting: Internal Medicine

## 2023-01-22 VITALS — BP 120/80 | HR 81 | Temp 98.8°F | Ht 67.0 in | Wt 161.0 lb

## 2023-01-22 DIAGNOSIS — M542 Cervicalgia: Secondary | ICD-10-CM

## 2023-01-22 DIAGNOSIS — S060X1A Concussion with loss of consciousness of 30 minutes or less, initial encounter: Secondary | ICD-10-CM

## 2023-01-22 DIAGNOSIS — F329 Major depressive disorder, single episode, unspecified: Secondary | ICD-10-CM

## 2023-01-22 DIAGNOSIS — M5440 Lumbago with sciatica, unspecified side: Secondary | ICD-10-CM | POA: Diagnosis not present

## 2023-01-22 DIAGNOSIS — G43911 Migraine, unspecified, intractable, with status migrainosus: Secondary | ICD-10-CM | POA: Diagnosis not present

## 2023-01-22 MED ORDER — AMITRIPTYLINE HCL 25 MG PO TABS: ORAL_TABLET | ORAL | 3 refills | Status: DC

## 2023-01-22 MED ORDER — HYDROCODONE-ACETAMINOPHEN 10-325 MG PO TABS: ORAL_TABLET | ORAL | 0 refills | Status: DC

## 2023-01-22 MED ORDER — DICLOFENAC SODIUM 75 MG PO TBEC: 75.0000 mg | DELAYED_RELEASE_TABLET | Freq: Two times a day (BID) | ORAL | 0 refills | Status: DC | PRN

## 2023-01-22 MED ORDER — ONDANSETRON HCL 4 MG PO TABS: 4.0000 mg | ORAL_TABLET | Freq: Three times a day (TID) | ORAL | 1 refills | Status: DC | PRN

## 2023-01-22 MED ORDER — METHOCARBAMOL 500 MG PO TABS: 500.0000 mg | ORAL_TABLET | Freq: Three times a day (TID) | ORAL | 0 refills | Status: DC | PRN

## 2023-01-22 MED ORDER — DILTIAZEM HCL ER COATED BEADS 120 MG PO CP24: 120.0000 mg | ORAL_CAPSULE | Freq: Every day | ORAL | 3 refills | Status: DC

## 2023-01-22 MED ORDER — ALPRAZOLAM 1 MG PO TABS: ORAL_TABLET | ORAL | 1 refills | Status: DC

## 2023-01-22 MED ORDER — RABEPRAZOLE SODIUM 20 MG PO TBEC: 20.0000 mg | DELAYED_RELEASE_TABLET | Freq: Every day | ORAL | 3 refills | Status: DC

## 2023-01-22 MED ORDER — ESZOPICLONE 3 MG PO TABS: 3.0000 mg | ORAL_TABLET | Freq: Every evening | ORAL | 1 refills | Status: DC | PRN

## 2023-01-22 MED ORDER — SPIRONOLACTONE 25 MG PO TABS: 25.0000 mg | ORAL_TABLET | Freq: Every day | ORAL | 3 refills | Status: DC
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
## 2023-01-31 ENCOUNTER — Telehealth: Payer: Self-pay | Admitting: Internal Medicine

## 2023-02-04 ENCOUNTER — Telehealth: Payer: Self-pay | Admitting: Internal Medicine

## 2023-02-04 NOTE — Telephone Encounter (Signed)
Patient is wanting to know if her FMLA paperwork has been completed yet?  Please call patient:  [phone]

## 2023-02-04 NOTE — Telephone Encounter (Signed)
Called pt inform we have not receive any FMLA forms. Pt is going to call them to have papers resent to my attention.Marland KitchenRaechel Chute

## 2023-02-05 NOTE — Telephone Encounter (Signed)
Place form in MD purple folder for completion../lmb 

## 2023-02-06 NOTE — Telephone Encounter (Signed)
Notified pt MD completed FMLA. Faxed back to [employer]Marland KitchenRaechel Chute

## 2023-02-07 NOTE — Telephone Encounter (Signed)
Thank you :)

## 2023-02-24 NOTE — Telephone Encounter (Signed)
Please print MD after signature on FMLA paperwork and accomodation forms as well.

## 2023-02-24 NOTE — Telephone Encounter (Signed)
Refaxed FMLA & Accomodation form back w/ MD signature w/credentials.Marland KitchenRaechel Fox

## 2023-03-03 ENCOUNTER — Encounter: Payer: Self-pay | Admitting: Family Medicine

## 2023-03-03 ENCOUNTER — Ambulatory Visit (INDEPENDENT_AMBULATORY_CARE_PROVIDER_SITE_OTHER): Payer: Managed Care, Other (non HMO) | Admitting: Family Medicine

## 2023-03-03 ENCOUNTER — Encounter
Payer: Managed Care, Other (non HMO) | Attending: Physical Medicine and Rehabilitation | Admitting: Physical Medicine and Rehabilitation

## 2023-03-03 ENCOUNTER — Encounter: Payer: Self-pay | Admitting: Physical Medicine and Rehabilitation

## 2023-03-03 VITALS — BP 122/80 | HR 74 | Temp 98.1°F | Resp 20 | Ht 67.0 in | Wt 162.0 lb

## 2023-03-03 VITALS — BP 133/90 | HR 108 | Temp 98.7°F | Ht 67.0 in | Wt 162.8 lb

## 2023-03-03 DIAGNOSIS — Z87820 Personal history of traumatic brain injury: Secondary | ICD-10-CM | POA: Insufficient documentation

## 2023-03-03 DIAGNOSIS — M7918 Myalgia, other site: Secondary | ICD-10-CM | POA: Diagnosis present

## 2023-03-03 DIAGNOSIS — R519 Headache, unspecified: Secondary | ICD-10-CM | POA: Insufficient documentation

## 2023-03-03 DIAGNOSIS — J0140 Acute pansinusitis, unspecified: Secondary | ICD-10-CM

## 2023-03-03 DIAGNOSIS — Z8739 Personal history of other diseases of the musculoskeletal system and connective tissue: Secondary | ICD-10-CM | POA: Insufficient documentation

## 2023-03-03 DIAGNOSIS — M545 Low back pain, unspecified: Secondary | ICD-10-CM | POA: Diagnosis present

## 2023-03-03 DIAGNOSIS — M4302 Spondylolysis, cervical region: Secondary | ICD-10-CM | POA: Insufficient documentation

## 2023-03-03 MED ORDER — CELECOXIB 50 MG PO CAPS: 50.0000 mg | ORAL_CAPSULE | Freq: Two times a day (BID) | ORAL | 5 refills | Status: DC

## 2023-03-03 MED ORDER — FLUCONAZOLE 150 MG PO TABS
150.0000 mg | ORAL_TABLET | Freq: Once | ORAL | 0 refills | Status: AC
Start: 2023-03-03 — End: 2023-03-03

## 2023-03-03 MED ORDER — AMOXICILLIN-POT CLAVULANATE 875-125 MG PO TABS
1.0000 | ORAL_TABLET | Freq: Two times a day (BID) | ORAL | 0 refills | Status: AC
Start: 2023-03-03 — End: 2023-03-10
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
## 2023-03-03 NOTE — Progress Notes (Signed)
Assessment & Plan:  1. Acute non-recurrent pansinusitis Education provided on sinus infections.  Discussed symptom management. - amoxicillin-clavulanate (AUGMENTIN) 875-125 MG tablet; Take 1 tablet by mouth 2 (two) times daily for 7 days.  Dispense: 14 tablet; Refill: 0 - fluconazole (DIFLUCAN) 150 MG tablet; Take 1 tablet (150 mg total) by mouth once for 1 dose. May repeat after 3 days if needed.  Dispense: 2 tablet; Refill: 0  No results found for any visits on 03/03/23.  Follow up plan: Return if symptoms worsen or fail to improve.  Deliah Boston, MSN, APRN, FNP-C  Subjective:  HPI: [name] is a 52 y.o. female presenting on 03/03/2023 for Cough (Cough, Ear pain/stopped up, yellow congestion, weak- started last WED (just got back from a cruise) /Covid home - Neg )  Patient complains of cough, head/chest congestion, headache, sneezing, sore throat, ear pain/pressure, and postnasal drainage. She denies fever, shortness of breath, and wheezing. Onset of symptoms was 5 days ago, gradually worsening since that time. She is drinking moderate amounts of fluids. Evaluation to date: home COVID test negative. Treatment to date:  Coricidin HBP . She does not smoke.    ROS: Negative unless specifically indicated above in HPI.   Relevant past medical history reviewed and updated as indicated.   Allergies and medications reviewed and updated.   Current Outpatient Medications:    ALPRAZolam (XANAX) 1 MG tablet, TAKE 1/2 TABLET BY MOUTH TWICE A DAY AS NEEDED FOR ANXIETY DONT TAKE WITH LUNESTA, Disp: 90 tablet, Rfl: 1   amitriptyline (ELAVIL) 25 MG tablet, TAKE 1 TO 2 TABLETS (25 TO 50 MG TOTAL) BY MOUTH AT BEDTIME, Disp: 180 tablet, Rfl: 3   aspirin 81 MG tablet, Take 81 mg by mouth daily., Disp: , Rfl:    Cholecalciferol ([employer] D3) 50 MCG (2000 UT) CAPS, Take 1 capsule (2,000 Units total) by mouth daily., Disp: 100 capsule, Rfl: 3   diltiazem (CARDIZEM CD) 120 MG 24 hr capsule, Take 1  capsule (120 mg total) by mouth daily., Disp: 90 capsule, Rfl: 3   Eszopiclone 3 MG TABS, Take 1 tablet (3 mg total) by mouth at bedtime as needed. Take immediately before bedtime, Disp: 90 tablet, Rfl: 1   fish oil-omega-3 fatty acids 1000 MG capsule, Take 1 g by mouth daily., Disp: , Rfl:    HYDROcodone-acetaminophen (NORCO) 10-325 MG tablet, TAKE 1 TABLET BY MOUTH EVERY 6 HOURS AS NEEDED FOR UP TO 5 DAYS., Disp: 60 tablet, Rfl: 0   methocarbamol (ROBAXIN) 500 MG tablet, Take 1 tablet (500 mg total) by mouth every 8 (eight) hours as needed for muscle spasms., Disp: 120 tablet, Rfl: 0   ondansetron (ZOFRAN) 4 MG tablet, Take 1 tablet (4 mg total) by mouth every 8 (eight) hours as needed for nausea or vomiting., Disp: 40 tablet, Rfl: 1   RABEprazole (ACIPHEX) 20 MG tablet, Take 1 tablet (20 mg total) by mouth daily. Annual appt due in Dec w/labs must see provider for future refills, Disp: 90 tablet, Rfl: 3   spironolactone (ALDACTONE) 25 MG tablet, Take 1 tablet (25 mg total) by mouth daily., Disp: 90 tablet, Rfl: 3   celecoxib (CELEBREX) 50 MG capsule, Take 1 capsule (50 mg total) by mouth 2 (two) times daily. (Patient not taking: Reported on 03/03/2023), Disp: 60 capsule, Rfl: 5  Allergies  Allergen Reactions   Oxycodone Itching   Oxycodone-Acetaminophen Rash    Objective:   BP 122/80   Pulse 74   Temp 98.1 F (  36.7 C)   Resp 20   Ht 5\' 7"  (1.702 m)   Wt 162 lb (73.5 kg)   BMI 25.37 kg/m    Physical Exam Vitals reviewed.  Constitutional:      General: She is not in acute distress.    Appearance: Normal appearance. She is not ill-appearing, toxic-appearing or diaphoretic.  HENT:     Head: Normocephalic and atraumatic.     Right Ear: Tympanic membrane, ear canal and external ear normal. There is no impacted cerumen.     Left Ear: Ear canal and external ear normal. There is no impacted cerumen. Tympanic membrane is erythematous and bulging. Tympanic membrane is not injected, scarred  or perforated.     Nose: No congestion or rhinorrhea.     Right Sinus: Maxillary sinus tenderness and frontal sinus tenderness present.     Left Sinus: Maxillary sinus tenderness and frontal sinus tenderness present.     Mouth/Throat:     Mouth: Mucous membranes are moist.     Pharynx: Oropharynx is clear. No oropharyngeal exudate or posterior oropharyngeal erythema.  Eyes:     General: No scleral icterus.       Right eye: No discharge.        Left eye: No discharge.     Conjunctiva/sclera: Conjunctivae normal.  Cardiovascular:     Rate and Rhythm: Normal rate and regular rhythm.     Heart sounds: Normal heart sounds. No murmur heard.    No friction rub. No gallop.  Pulmonary:     Effort: Pulmonary effort is normal. No respiratory distress.     Breath sounds: Normal breath sounds. No stridor. No wheezing, rhonchi or rales.  Musculoskeletal:        General: Normal range of motion.     Cervical back: Normal range of motion.  Lymphadenopathy:     Cervical: Cervical adenopathy (left side) present.  Skin:    General: Skin is warm and dry.     Capillary Refill: Capillary refill takes less than 2 seconds.  Neurological:     General: No focal deficit present.     Mental Status: She is alert and oriented to person, place, and time. Mental status is at baseline.  Psychiatric:        Mood and Affect: Mood normal.        Behavior: Behavior normal.        Thought Content: Thought content normal.        Judgment: Judgment normal.
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
## 2023-04-09 ENCOUNTER — Encounter: Payer: Self-pay | Admitting: Physical Medicine and Rehabilitation

## 2023-04-09 ENCOUNTER — Encounter
Payer: Managed Care, Other (non HMO) | Attending: Physical Medicine and Rehabilitation | Admitting: Physical Medicine and Rehabilitation

## 2023-04-09 VITALS — BP 130/89 | HR 98 | Ht 67.0 in | Wt 162.8 lb

## 2023-04-09 DIAGNOSIS — I10 Essential (primary) hypertension: Secondary | ICD-10-CM | POA: Diagnosis not present

## 2023-04-09 DIAGNOSIS — M4302 Spondylolysis, cervical region: Secondary | ICD-10-CM | POA: Diagnosis not present

## 2023-04-09 DIAGNOSIS — R519 Headache, unspecified: Secondary | ICD-10-CM | POA: Insufficient documentation

## 2023-04-09 DIAGNOSIS — M7918 Myalgia, other site: Secondary | ICD-10-CM | POA: Diagnosis not present

## 2023-04-09 MED ORDER — LIDOCAINE HCL 1 % IJ SOLN
9.0000 mL | Freq: Once | INTRAMUSCULAR | Status: AC
Start: 2023-04-09 — End: 2023-04-09
  Administered 2023-04-09: 6 mL

## 2023-04-09 MED ORDER — TOPIRAMATE 25 MG PO TABS: 25.0000 mg | ORAL_TABLET | Freq: Two times a day (BID) | ORAL | 3 refills | Status: DC

## 2023-04-09 MED ORDER — TRIAMCINOLONE ACETONIDE 40 MG/ML IJ SUSP
24.0000 mg | Freq: Once | INTRAMUSCULAR | Status: AC
Start: 2023-04-09 — End: 2023-04-09
  Administered 2023-04-09: 0.16 mg
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
## 2023-04-10 ENCOUNTER — Telehealth: Payer: Self-pay | Admitting: *Deleted

## 2023-04-10 MED ORDER — DICLOFENAC SODIUM 75 MG PO TBEC: 75.0000 mg | DELAYED_RELEASE_TABLET | Freq: Two times a day (BID) | ORAL | 1 refills | Status: DC | PRN

## 2023-04-10 NOTE — Telephone Encounter (Signed)
Rec'd fax pt requesting refill on Diclofenac 75 mg take 1 twice a day as needed for moderate pain. Med not on med list pls advise...Raechel Chute

## 2023-04-10 NOTE — Telephone Encounter (Signed)
Ok - [facility] Thx

## 2023-04-10 NOTE — Telephone Encounter (Signed)
MD sent rx to [facility].Marland KitchenRaechel Chute

## 2023-04-11 ENCOUNTER — Telehealth: Payer: Self-pay | Admitting: Internal Medicine

## 2023-04-11 NOTE — Telephone Encounter (Signed)
Patient called and said she used to be on diltiazem (CARDIZEM CD) 180 mg. She is now currently on diltiazem (CARDIZEM CD) 120 MG 24 hr capsule and has noticed an increase in her blood pressure. She would like to know if it can be switched back to the 180 mg dosage. She would like a call back from [place] at [phone].

## 2023-04-13 MED ORDER — DILTIAZEM HCL ER COATED BEADS 180 MG PO CP24: 180.0000 mg | ORAL_CAPSULE | Freq: Every day | ORAL | 3 refills | Status: DC

## 2023-04-13 NOTE — Telephone Encounter (Signed)
Okay.  Will do.  Thank you 

## 2023-04-23 ENCOUNTER — Ambulatory Visit (INDEPENDENT_AMBULATORY_CARE_PROVIDER_SITE_OTHER): Payer: Managed Care, Other (non HMO) | Admitting: Internal Medicine

## 2023-04-23 ENCOUNTER — Encounter: Payer: Self-pay | Admitting: Internal Medicine

## 2023-04-23 VITALS — BP 118/84 | HR 104 | Temp 98.6°F | Ht 67.0 in | Wt 164.0 lb

## 2023-04-23 DIAGNOSIS — F419 Anxiety disorder, unspecified: Secondary | ICD-10-CM

## 2023-04-23 DIAGNOSIS — E559 Vitamin D deficiency, unspecified: Secondary | ICD-10-CM

## 2023-04-23 DIAGNOSIS — F329 Major depressive disorder, single episode, unspecified: Secondary | ICD-10-CM

## 2023-04-23 DIAGNOSIS — M542 Cervicalgia: Secondary | ICD-10-CM

## 2023-04-23 DIAGNOSIS — G43911 Migraine, unspecified, intractable, with status migrainosus: Secondary | ICD-10-CM

## 2023-04-23 DIAGNOSIS — K5904 Chronic idiopathic constipation: Secondary | ICD-10-CM

## 2023-04-23 NOTE — Assessment & Plan Note (Addendum)
On Elavil Rx

## 2023-04-23 NOTE — Assessment & Plan Note (Signed)
Dissolve Miralax 4-6 scoops in a bottle of water - drink over 2-4 hrs Take 2 Dulcolax tablets twice a day Use Fleet suppository and/or Fleet enema if no result 
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
## 2023-04-23 NOTE — Patient Instructions (Signed)
Dissolve Miralax 4-6 scoops in a bottle of water - drink over 2-4 hrs Take 2 Dulcolax tablets twice a day Use Fleet suppository and/or Fleet enema if no result
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
## 2023-04-23 NOTE — Assessment & Plan Note (Signed)
On Vit D 
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
## 2023-04-29 ENCOUNTER — Telehealth: Payer: Self-pay

## 2023-04-29 NOTE — Telephone Encounter (Signed)
Patient called and states her pain level has been at a 10+ since injections at last visit
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
## 2023-05-07 ENCOUNTER — Encounter
Payer: Managed Care, Other (non HMO) | Attending: Physical Medicine and Rehabilitation | Admitting: Physical Medicine and Rehabilitation

## 2023-05-07 ENCOUNTER — Encounter: Payer: Self-pay | Admitting: Physical Medicine and Rehabilitation

## 2023-05-07 VITALS — BP 125/85 | HR 92 | Ht 67.0 in | Wt 161.0 lb

## 2023-05-07 DIAGNOSIS — G43711 Chronic migraine without aura, intractable, with status migrainosus: Secondary | ICD-10-CM | POA: Insufficient documentation

## 2023-05-07 DIAGNOSIS — M4302 Spondylolysis, cervical region: Secondary | ICD-10-CM | POA: Diagnosis present

## 2023-05-07 DIAGNOSIS — M7918 Myalgia, other site: Secondary | ICD-10-CM | POA: Insufficient documentation

## 2023-05-07 DIAGNOSIS — Z87820 Personal history of traumatic brain injury: Secondary | ICD-10-CM | POA: Diagnosis present

## 2023-05-07 MED ORDER — SUMATRIPTAN SUCCINATE 50 MG PO TABS: 25.0000 mg | ORAL_TABLET | ORAL | 3 refills | Status: DC | PRN

## 2023-05-07 MED ORDER — GABAPENTIN 600 MG PO TABS: 300.0000 mg | ORAL_TABLET | Freq: Every day | ORAL | 2 refills | Status: DC

## 2023-05-07 NOTE — Patient Instructions (Addendum)
I am starting you on gabapentin 600 mg tablets. Take 0.5 tablets at bedtime for 1 week, then if tolerating increase to 1 tablet at bedtime.  I am also starting you on Sumatriptain 50 mg tablets. Take 0.5 tablets at the onset of a headache and re-dose every 2 hours up to 3 times per day. You will get 10 tabs per month.  Message me in 2 weeks to let me know how these medications are going.  I am sending you back to PT to work on neck massage, sterngthening, range of motion, and possibly traction to help with your symptoms.  Follow up with me or Dr. Berline Chough in 2 months  I recommended referrals to Dr. Everlena Cooper and to Nuerosurgery for cervical injections today; while we have decided against these, please let me know if you reconsider and I will send those referrals.  Keep a headache diary for our next appointment

## 2023-05-19 ENCOUNTER — Ambulatory Visit: Payer: Managed Care, Other (non HMO) | Attending: Physical Medicine and Rehabilitation | Admitting: Physical Therapy

## 2023-05-19 DIAGNOSIS — R42 Dizziness and giddiness: Secondary | ICD-10-CM | POA: Diagnosis present

## 2023-05-19 DIAGNOSIS — M6281 Muscle weakness (generalized): Secondary | ICD-10-CM | POA: Diagnosis present

## 2023-05-19 DIAGNOSIS — M542 Cervicalgia: Secondary | ICD-10-CM

## 2023-05-19 DIAGNOSIS — G43711 Chronic migraine without aura, intractable, with status migrainosus: Secondary | ICD-10-CM | POA: Insufficient documentation

## 2023-05-19 DIAGNOSIS — Z87820 Personal history of traumatic brain injury: Secondary | ICD-10-CM | POA: Diagnosis not present

## 2023-05-19 DIAGNOSIS — M4302 Spondylolysis, cervical region: Secondary | ICD-10-CM | POA: Insufficient documentation

## 2023-05-19 DIAGNOSIS — M7918 Myalgia, other site: Secondary | ICD-10-CM | POA: Insufficient documentation
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
## 2023-05-29 ENCOUNTER — Ambulatory Visit: Payer: Managed Care, Other (non HMO) | Admitting: Physical Therapy

## 2023-05-29 ENCOUNTER — Encounter: Payer: Self-pay | Admitting: Physical Therapy

## 2023-05-29 DIAGNOSIS — R42 Dizziness and giddiness: Secondary | ICD-10-CM

## 2023-05-29 DIAGNOSIS — M542 Cervicalgia: Secondary | ICD-10-CM | POA: Diagnosis not present

## 2023-05-29 DIAGNOSIS — M6281 Muscle weakness (generalized): Secondary | ICD-10-CM
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
## 2023-06-05 ENCOUNTER — Encounter: Payer: Self-pay | Admitting: Physical Therapy

## 2023-06-05 ENCOUNTER — Ambulatory Visit: Payer: Managed Care, Other (non HMO) | Admitting: Physical Therapy

## 2023-06-05 DIAGNOSIS — M542 Cervicalgia: Secondary | ICD-10-CM | POA: Diagnosis not present

## 2023-06-05 DIAGNOSIS — R42 Dizziness and giddiness: Secondary | ICD-10-CM

## 2023-06-05 NOTE — Patient Instructions (Signed)
Gaze Stabilization: Sitting    Keeping eyes on target on wall a few eet away, tilt head down 15-30 and move head side to side SLOWLY for 10 times.  Perform 2-3 sets.  Do __2__ sessions per day.  Use a plain background  Copyright  VHI. All rights reserved.
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
## 2023-06-12 ENCOUNTER — Encounter: Payer: Self-pay | Admitting: Physical Therapy

## 2023-06-12 ENCOUNTER — Ambulatory Visit: Payer: Managed Care, Other (non HMO) | Admitting: Physical Therapy

## 2023-06-12 VITALS — BP 138/100 | HR 108

## 2023-06-12 DIAGNOSIS — M542 Cervicalgia: Secondary | ICD-10-CM

## 2023-06-12 DIAGNOSIS — M6281 Muscle weakness (generalized): Secondary | ICD-10-CM

## 2023-06-12 DIAGNOSIS — R42 Dizziness and giddiness: Secondary | ICD-10-CM
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
## 2023-06-19 ENCOUNTER — Encounter: Payer: Self-pay | Admitting: Family Medicine

## 2023-06-19 ENCOUNTER — Ambulatory Visit (INDEPENDENT_AMBULATORY_CARE_PROVIDER_SITE_OTHER): Payer: Managed Care, Other (non HMO) | Admitting: Family Medicine

## 2023-06-19 ENCOUNTER — Encounter: Payer: Self-pay | Admitting: Physical Therapy

## 2023-06-19 ENCOUNTER — Ambulatory Visit: Payer: Managed Care, Other (non HMO) | Admitting: Physical Therapy

## 2023-06-19 VITALS — BP 134/94 | HR 84 | Temp 97.6°F | Ht 67.0 in

## 2023-06-19 VITALS — BP 134/101 | HR 105

## 2023-06-19 DIAGNOSIS — R42 Dizziness and giddiness: Secondary | ICD-10-CM | POA: Insufficient documentation

## 2023-06-19 DIAGNOSIS — M542 Cervicalgia: Secondary | ICD-10-CM

## 2023-06-19 DIAGNOSIS — J0140 Acute pansinusitis, unspecified: Secondary | ICD-10-CM | POA: Diagnosis not present

## 2023-06-19 DIAGNOSIS — I10 Essential (primary) hypertension: Secondary | ICD-10-CM

## 2023-06-19 DIAGNOSIS — H6691 Otitis media, unspecified, right ear: Secondary | ICD-10-CM | POA: Diagnosis not present

## 2023-06-19 DIAGNOSIS — M6281 Muscle weakness (generalized): Secondary | ICD-10-CM | POA: Insufficient documentation

## 2023-06-19 MED ORDER — AMOXICILLIN-POT CLAVULANATE 875-125 MG PO TABS
1.0000 | ORAL_TABLET | Freq: Two times a day (BID) | ORAL | 0 refills | Status: DC
Start: 2023-06-19 — End: 2023-10-29

## 2023-06-19 MED ORDER — FLUCONAZOLE 150 MG PO TABS: 150.0000 mg | ORAL_TABLET | Freq: Once | ORAL | 0 refills | Status: AC

## 2023-06-19 NOTE — Therapy (Signed)
[facility] [address] [place], [place], [address] Phone: [phone]   Fax:  [phone]  Patient Details  Name: [name] MRN: [id number] Date of Birth: 06-23-1971 Referring Provider:  Angelina Sheriff, DO  Encounter Date: 06/19/2023  Session arrive no charge. Patient reporting that BP has been elevated in the last few sessions. Patient reports that she did take BP medication this morning at 7:00am. VITALS: 134/101 mmHg (105 bpm); 142/100 mmHg (100 bpm). Patient encouraged to follow up with PCP and will continue POC as able. Educated on when to go to ED.   Carmelia Bake, PT, DPT 06/19/2023, 11:12 AM  [facility] [address] [place], [place], [address] Phone: [phone]   Fax:  [phone]
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
## 2023-06-19 NOTE — Patient Instructions (Signed)
Take the antibiotic with food and plenty of water.   Use saline nasal spray.   Plain over the counter Mucinex or Coricidin.   Your blood pressure at home.  Your blood pressure should return to baseline when you are feeling better.  If not, please follow-up with Dr. Posey Rea.

## 2023-06-24 ENCOUNTER — Telehealth: Payer: Self-pay | Admitting: Internal Medicine

## 2023-06-24 NOTE — Telephone Encounter (Signed)
Pt came in [name] stated she had a sinus infection and ear infection along with her blood pressure is high ranging in 150/101 as of this morning pt wanted to get a response from her Doctor of what she need to do. Please advise.  Best call number [phone]

## 2023-06-25 NOTE — Telephone Encounter (Signed)
Finish antibiotic. Check blood pressure at home for a week or 2. Let me know if blood pressure continues to be elevated. Thank you

## 2023-06-26 ENCOUNTER — Ambulatory Visit: Payer: Managed Care, Other (non HMO) | Admitting: Physical Therapy

## 2023-06-26 NOTE — Telephone Encounter (Signed)
Spoke with pt and was able to inform her of Dr. Loren Racer advice and she states understanding. Pt stated she will call in 1 week with BP update and if still high pt is to schedule an apptmnt to see PCP.

## 2023-07-03 ENCOUNTER — Ambulatory Visit: Payer: Managed Care, Other (non HMO) | Attending: Physical Medicine and Rehabilitation | Admitting: Physical Therapy

## 2023-07-03 ENCOUNTER — Encounter: Payer: Self-pay | Admitting: Physical Therapy

## 2023-07-03 VITALS — BP 138/85 | HR 97

## 2023-07-03 DIAGNOSIS — R42 Dizziness and giddiness: Secondary | ICD-10-CM | POA: Diagnosis present

## 2023-07-03 DIAGNOSIS — M6281 Muscle weakness (generalized): Secondary | ICD-10-CM | POA: Diagnosis present

## 2023-07-03 DIAGNOSIS — M542 Cervicalgia: Secondary | ICD-10-CM | POA: Diagnosis present
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
## 2023-07-10 ENCOUNTER — Encounter: Payer: Self-pay | Admitting: Physical Therapy

## 2023-07-10 ENCOUNTER — Ambulatory Visit: Payer: Managed Care, Other (non HMO) | Admitting: Physical Therapy

## 2023-07-10 VITALS — BP 133/88 | HR 88

## 2023-07-10 DIAGNOSIS — R42 Dizziness and giddiness: Secondary | ICD-10-CM

## 2023-07-10 DIAGNOSIS — M6281 Muscle weakness (generalized): Secondary | ICD-10-CM

## 2023-07-10 DIAGNOSIS — M542 Cervicalgia: Secondary | ICD-10-CM | POA: Diagnosis not present
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
## 2023-07-11 ENCOUNTER — Encounter: Payer: Managed Care, Other (non HMO) | Admitting: Physical Medicine and Rehabilitation

## 2023-07-16 ENCOUNTER — Ambulatory Visit: Payer: Managed Care, Other (non HMO) | Admitting: Physical Therapy

## 2023-07-16 ENCOUNTER — Encounter: Payer: Self-pay | Admitting: Internal Medicine

## 2023-07-16 ENCOUNTER — Ambulatory Visit: Payer: Managed Care, Other (non HMO) | Admitting: Internal Medicine

## 2023-07-16 VITALS — BP 118/84 | HR 84 | Temp 98.6°F | Ht 67.0 in

## 2023-07-16 DIAGNOSIS — F5102 Adjustment insomnia: Secondary | ICD-10-CM | POA: Diagnosis not present

## 2023-07-16 DIAGNOSIS — I10 Essential (primary) hypertension: Secondary | ICD-10-CM

## 2023-07-16 DIAGNOSIS — G43911 Migraine, unspecified, intractable, with status migrainosus: Secondary | ICD-10-CM | POA: Diagnosis not present

## 2023-07-16 DIAGNOSIS — F419 Anxiety disorder, unspecified: Secondary | ICD-10-CM | POA: Diagnosis not present

## 2023-07-16 DIAGNOSIS — K219 Gastro-esophageal reflux disease without esophagitis: Secondary | ICD-10-CM

## 2023-07-16 MED ORDER — LACTULOSE 20 GM/30ML PO SOLN: 30.0000 mL | Freq: Two times a day (BID) | ORAL | 3 refills | Status: DC | PRN
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
## 2023-07-16 NOTE — Assessment & Plan Note (Signed)
On Aciphex
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
## 2023-07-16 NOTE — Assessment & Plan Note (Signed)
Treat GERD w/Aciphex Continue w/lunesta prn  Potential benefits of a long term benzodiazepines  use as well as potential risks  and complications were explained to the patient and were aknowledged.

## 2023-07-16 NOTE — Assessment & Plan Note (Addendum)
Continue on Diltiazem, Spironolactone

## 2023-07-16 NOTE — Assessment & Plan Note (Signed)
On Elavil Rx

## 2023-07-17 ENCOUNTER — Encounter: Payer: Self-pay | Admitting: Physical Therapy

## 2023-07-17 ENCOUNTER — Ambulatory Visit: Payer: Managed Care, Other (non HMO) | Attending: Physical Medicine and Rehabilitation | Admitting: Physical Therapy

## 2023-07-17 VITALS — BP 132/98

## 2023-07-17 DIAGNOSIS — R42 Dizziness and giddiness: Secondary | ICD-10-CM | POA: Diagnosis present

## 2023-07-17 DIAGNOSIS — M542 Cervicalgia: Secondary | ICD-10-CM

## 2023-07-17 DIAGNOSIS — M6281 Muscle weakness (generalized): Secondary | ICD-10-CM | POA: Diagnosis present
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
## 2023-07-21 ENCOUNTER — Other Ambulatory Visit: Payer: Managed Care, Other (non HMO)

## 2023-07-21 ENCOUNTER — Other Ambulatory Visit: Payer: Self-pay | Admitting: Internal Medicine

## 2023-07-21 DIAGNOSIS — G43911 Migraine, unspecified, intractable, with status migrainosus: Secondary | ICD-10-CM

## 2023-07-21 DIAGNOSIS — I10 Essential (primary) hypertension: Secondary | ICD-10-CM

## 2023-07-21 DIAGNOSIS — E876 Hypokalemia: Secondary | ICD-10-CM

## 2023-07-21 DIAGNOSIS — K219 Gastro-esophageal reflux disease without esophagitis: Secondary | ICD-10-CM

## 2023-07-21 DIAGNOSIS — F419 Anxiety disorder, unspecified: Secondary | ICD-10-CM

## 2023-07-22 LAB — CBC WITH DIFFERENTIAL/PLATELET
Basophils Absolute: 0 10*3/uL (ref 0.0–0.2)
Basos: 0 %
EOS (ABSOLUTE): 0.1 10*3/uL (ref 0.0–0.4)
Eos: 1 %
Hematocrit: 39.3 % (ref 34.0–46.6)
Hemoglobin: 12.7 g/dL (ref 11.1–15.9)
Immature Grans (Abs): 0 10*3/uL (ref 0.0–0.1)
Immature Granulocytes: 0 %
Lymphocytes Absolute: 3 10*3/uL (ref 0.7–3.1)
Lymphs: 38 %
MCH: 27.9 pg (ref 26.6–33.0)
MCHC: 32.3 g/dL (ref 31.5–35.7)
MCV: 86 fL (ref 79–97)
Monocytes Absolute: 0.5 10*3/uL (ref 0.1–0.9)
Monocytes: 6 %
Neutrophils Absolute: 4.2 10*3/uL (ref 1.4–7.0)
Neutrophils: 55 %
Platelets: 381 10*3/uL (ref 150–450)
RBC: 4.55 x10E6/uL (ref 3.77–5.28)
RDW: 13 % (ref 11.7–15.4)
WBC: 7.8 10*3/uL (ref 3.4–10.8)

## 2023-07-22 LAB — COMPREHENSIVE METABOLIC PANEL
ALT: 17 [IU]/L (ref 0–32)
AST: 21 [IU]/L (ref 0–40)
Albumin: 4.7 g/dL (ref 3.8–4.9)
Alkaline Phosphatase: 107 [IU]/L (ref 44–121)
BUN/Creatinine Ratio: 9 (ref 9–23)
BUN: 7 mg/dL (ref 6–24)
Bilirubin Total: 0.3 mg/dL (ref 0.0–1.2)
CO2: 26 mmol/L (ref 20–29)
Calcium: 10.2 mg/dL (ref 8.7–10.2)
Chloride: 103 mmol/L (ref 96–106)
Creatinine, Ser: 0.8 mg/dL (ref 0.57–1.00)
Globulin, Total: 2.6 g/dL (ref 1.5–4.5)
Glucose: 93 mg/dL (ref 70–99)
Potassium: 3.9 mmol/L (ref 3.5–5.2)
Sodium: 141 mmol/L (ref 134–144)
Total Protein: 7.3 g/dL (ref 6.0–8.5)
eGFR: 88 mL/min/{1.73_m2} (ref >59)

## 2023-07-22 LAB — URINALYSIS
Bilirubin, UA: NEGATIVE
Glucose, UA: NEGATIVE
Ketones, UA: NEGATIVE
Nitrite, UA: NEGATIVE
RBC, UA: NEGATIVE
Specific Gravity, UA: 1.017 (ref 1.005–1.030)
Urobilinogen, Ur: 0.2 mg/dL (ref 0.2–1.0)
pH, UA: 6.5 (ref 5.0–7.5)

## 2023-07-22 LAB — TSH: TSH: 0.96 u[IU]/mL (ref 0.450–4.500)

## 2023-07-24 ENCOUNTER — Other Ambulatory Visit: Payer: Self-pay | Admitting: Internal Medicine

## 2023-07-28 ENCOUNTER — Other Ambulatory Visit: Payer: Self-pay | Admitting: Internal Medicine

## 2023-07-28 NOTE — Telephone Encounter (Signed)
Pt called stating she need  FMLA forms fax over to [employer] Fax [phone]  Bets call back number [phone]

## 2023-07-29 ENCOUNTER — Ambulatory Visit: Payer: Managed Care, Other (non HMO) | Admitting: Internal Medicine

## 2023-07-29 ENCOUNTER — Telehealth: Payer: Self-pay | Admitting: Internal Medicine

## 2023-07-29 NOTE — Telephone Encounter (Signed)
Patient called and said she spoke with someone in this office yesterday about faxing her FMLA paperwork. She was unsure of their name. She said it was supposed to be faxed to [employer] and that they haven't received it. Patient would like an update on the status of the paperwork. Best callback is [phone].

## 2023-07-29 NOTE — Telephone Encounter (Signed)
[employer] said that the first form of the form Employee Checklist needs to be checked off with the intermittent absences checked off.  Please complete this and refax - Just the 1st part. - Patient wants Byrd Hesselbach to call her back or leave message when completed

## 2023-08-12 NOTE — Telephone Encounter (Signed)
Pt called this morning needing more paper filled out about her FMLA paper work. Pt stated she tried sending a message but it would not go through asking can you please give her a call.    [phone]

## 2023-08-13 NOTE — Telephone Encounter (Signed)
FMLA paperwork has been received via fax, will be in providers box up front.

## 2023-08-19 NOTE — Telephone Encounter (Signed)
Patient would like for Byrd Hesselbach to message her and let her know when it's been completed

## 2023-08-19 NOTE — Telephone Encounter (Signed)
Patient called and said that the fmla people need section C on page 4 filled out with the start date, the end date and Dr. Tommy Medal signature - it has to be in by next week.  Thank you

## 2023-08-20 NOTE — Telephone Encounter (Signed)
I think it has been done.  Thanks

## 2023-09-03 NOTE — Telephone Encounter (Signed)
Spoke with pt and was able to discuss FMLA needs. Pt will be in on 09/04/2023 to sign paperwork.

## 2023-09-03 NOTE — Telephone Encounter (Signed)
Patient would like a call back from [facility] regarding her FMLA paperwork. Best callback is [phone].

## 2023-09-08 NOTE — Telephone Encounter (Signed)
Patient called and said the dates for her FMLA paperwork need to be updated. She said it needs to be 08/21/2023-02/18/2024. Patient would like a call back at [phone].

## 2023-09-29 ENCOUNTER — Other Ambulatory Visit: Payer: Self-pay | Admitting: Obstetrics and Gynecology

## 2023-09-29 DIAGNOSIS — Z0000 Encounter for general adult medical examination without abnormal findings: Secondary | ICD-10-CM

## 2023-09-30 ENCOUNTER — Other Ambulatory Visit: Payer: Self-pay | Admitting: Internal Medicine

## 2023-10-16 ENCOUNTER — Ambulatory Visit: Payer: Managed Care, Other (non HMO) | Admitting: Internal Medicine

## 2023-10-16 ENCOUNTER — Encounter: Payer: Self-pay | Admitting: Internal Medicine

## 2023-10-16 VITALS — BP 112/68 | HR 65 | Temp 98.0°F | Ht 67.0 in

## 2023-10-16 DIAGNOSIS — I10 Essential (primary) hypertension: Secondary | ICD-10-CM | POA: Diagnosis not present

## 2023-10-16 DIAGNOSIS — K219 Gastro-esophageal reflux disease without esophagitis: Secondary | ICD-10-CM | POA: Diagnosis not present

## 2023-10-16 DIAGNOSIS — S060X1A Concussion with loss of consciousness of 30 minutes or less, initial encounter: Secondary | ICD-10-CM | POA: Diagnosis not present

## 2023-10-16 DIAGNOSIS — L819 Disorder of pigmentation, unspecified: Secondary | ICD-10-CM

## 2023-10-16 MED ORDER — RABEPRAZOLE SODIUM 20 MG PO TBEC: 20.0000 mg | DELAYED_RELEASE_TABLET | Freq: Two times a day (BID) | ORAL | 3 refills | Status: DC

## 2023-10-16 NOTE — Assessment & Plan Note (Signed)
 Dark skin on buttocks R>>L from the heating pad Aquaphor D/c heating pad use
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
## 2023-10-16 NOTE — Assessment & Plan Note (Addendum)
 Worse. On Aciphex: will use bid  Potential benefits of a long term PPI use as well as potential risks  and complications were explained to the patient and were aknowledged.

## 2023-10-16 NOTE — Assessment & Plan Note (Signed)
Continue on Diltiazem, Spironolactone
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
## 2023-10-22 ENCOUNTER — Ambulatory Visit
Admission: RE | Admit: 2023-10-22 | Discharge: 2023-10-22 | Disposition: A | Discharge status: Home / Self Care | Payer: Managed Care, Other (non HMO) | Source: Ambulatory Visit | Attending: Obstetrics and Gynecology | Admitting: Obstetrics and Gynecology

## 2023-10-22 ENCOUNTER — Other Ambulatory Visit (HOSPITAL_COMMUNITY): Payer: Self-pay

## 2023-10-22 DIAGNOSIS — Z0000 Encounter for general adult medical examination without abnormal findings: Secondary | ICD-10-CM

## 2023-10-28 ENCOUNTER — Other Ambulatory Visit (HOSPITAL_COMMUNITY): Payer: Self-pay

## 2023-10-28 ENCOUNTER — Ambulatory Visit: Payer: Self-pay | Admitting: Internal Medicine

## 2023-10-28 NOTE — Telephone Encounter (Signed)
 Copied from CRM [id number]. Topic: Clinical - Pink Word Triage >> Oct 28, 2023  2:29 PM Burnard DEL wrote: Reason for Triage:headache,diarrhea,stomach cramps,nausea,ear pain that started on Saturday   Chief Complaint: Abdominal cramping Symptoms: Diffuse abdominal cramping, diarrhea (resolved), nausea, headache  Frequency: Intermittent  Pertinent Negatives: Patient denies vomiting Disposition: [] ED /[] Urgent Care (no appt availability in office) / [x] Appointment(In office/virtual)/ []  [facility]/ [] Home Care/ [] Refused Recommended Disposition /[] [facility]/ []  Follow-up with PCP Additional Notes: Patient reports that she began to experience abdominal cramping 3 days ago. She states that her cramping is intermittent and fluctuates from 5/10-10/10 pain. She states that 2 days ago she began to experience diarrhea that resolved yesterday, stating she has not had a bowel movement today. Patient states that she has a history of constipation and took a laxative 5 days ago. Patient is also reporting nausea and headache. She denies any vomiting. Appointment made for patient and patient instructed to call back for new or worsening symptoms.     Reason for Disposition  [1] MODERATE pain (e.g., interferes with normal activities) AND [2] pain comes and goes (cramps) AND [3] present > 24 hours  (Exception: Pain with Vomiting or Diarrhea - see that Guideline.)  Answer Assessment - Initial Assessment Questions 1. LOCATION: Where does it hurt?      Diffuse  2. RADIATION: Does the pain shoot anywhere else? (e.g., chest, back)     Lower back  3. ONSET: When did the pain begin? (e.g., minutes, hours or days ago)      3 days 4. SUDDEN: Gradual or sudden onset?     Gradual  5. PATTERN Does the pain come and go, or is it constant?    - If it comes and goes: How long does it last? Do you have pain now?     (Note: Comes and goes means the pain is intermittent. It goes away  completely between bouts.)    - If constant: Is it getting better, staying the same, or getting worse?      (Note: Constant means the pain never goes away completely; most serious pain is constant and gets worse.)      Intermittent  6. SEVERITY: How bad is the pain?  (e.g., Scale 1-10; mild, moderate, or severe)    - MILD (1-3): Doesn't interfere with normal activities, abdomen soft and not tender to touch.     - MODERATE (4-7): Interferes with normal activities or awakens from sleep, abdomen tender to touch.     - SEVERE (8-10): Excruciating pain, doubled over, unable to do any normal activities.       5/10, goes up to 10/10 7. RECURRENT SYMPTOM: Have you ever had this type of stomach pain before? If Yes, ask: When was the last time? and What happened that time?      History of constipation, takes laxatives 8. CAUSE: What do you think is causing the stomach pain?     Unsure  9. RELIEVING/AGGRAVATING FACTORS: What makes it better or worse? (e.g., antacids, bending or twisting motion, bowel movement)     Bowel movement improves pain 10. OTHER SYMPTOMS: Do you have any other symptoms? (e.g., back pain, diarrhea, fever, urination pain, vomiting)       Diarrhea (resolved), nausea, headache 11. PREGNANCY: Is there any chance you are pregnant? When was your last menstrual period?       No  Protocols used: Abdominal Pain - Female-A-AH

## 2023-10-28 NOTE — Telephone Encounter (Signed)
 1st attempt, LVM requesting pt call office back for triage  Copied from CRM [id number]. Topic: Clinical - Pink Word Triage >> Oct 28, 2023  2:29 PM Fredrich Romans wrote: Reason for Triage:headache,diarrhea,stomach cramps,nausea,ear pain that started on Saturday

## 2023-10-29 ENCOUNTER — Ambulatory Visit: Payer: Managed Care, Other (non HMO) | Admitting: Nurse Practitioner

## 2023-10-29 VITALS — BP 110/86 | HR 90 | Temp 98.2°F | Ht 67.0 in | Wt 160.2 lb

## 2023-10-29 DIAGNOSIS — K529 Noninfective gastroenteritis and colitis, unspecified: Secondary | ICD-10-CM | POA: Insufficient documentation

## 2023-10-29 DIAGNOSIS — J3489 Other specified disorders of nose and nasal sinuses: Secondary | ICD-10-CM | POA: Diagnosis not present

## 2023-10-29 MED ORDER — IBUPROFEN 600 MG PO TABS
600.0000 mg | ORAL_TABLET | Freq: Three times a day (TID) | ORAL | 0 refills | Status: DC | PRN
Start: 2023-10-29 — End: 2024-06-03

## 2023-10-29 MED ORDER — ONDANSETRON HCL 4 MG PO TABS: 4.0000 mg | ORAL_TABLET | Freq: Three times a day (TID) | ORAL | 0 refills | Status: DC | PRN

## 2023-10-29 NOTE — Assessment & Plan Note (Signed)
 Reports some headache and did have tenderness with palpation to bilateral maxillary sinuses. Symptom onset 4 days ago, may be early sinusitis, likely viral at this time. Recommend treatment with ibuprofen  as needed and reach out next week if symptoms continue to persist to be evaluated for secondary bacterial infection.

## 2023-10-29 NOTE — Patient Instructions (Addendum)
 Return to office if symptoms do not improve by next week.   Do not take both ibuprofen  and diclofenac  tablets. Take one or the other. Try to eat something with the ibuprofen  to protect lining of your stomach.  Patient has today for 3

## 2023-10-29 NOTE — Assessment & Plan Note (Addendum)
 Acute, improving Vital signs stable Overall patient appears stable, discussed collecting labs for evaluation but patient declined today.  Likely acute viral gastroenteritis.  Recommend symptom management with rest, hydration, as needed Zofran , as needed ibuprofen . If symptoms persist or do not improve by next week patient was encouraged to return to clinic for further evaluation.

## 2023-10-29 NOTE — Progress Notes (Addendum)
 Established Patient Office Visit  Subjective   Patient ID: [name], female    DOB: [birth year]  Age: 53 y.o. MRN: [id number]  Chief Complaint  Patient presents with   Abdominal Pain    Abdominal cramps, headache and nausea d Both ear pain, has not taken any meds apart from Advil  for the headache. Been going for three days     History of abdominal cramps, headache, nausea.  Was also experiencing diarrhea but the diarrhea has resolved.  No known sick contacts.  Has been treating at home with Advil  without much improvement in symptoms.  No vomiting.  Reports history of appendectomy.  Has had chills but denies fever.  Main concern now is that she is feeling fatigued.     ROS: see HPI    Objective:     BP 110/86   Pulse 90   Temp 98.2 F (36.8 C) (Temporal)   Ht 5\' 7"  (1.702 m)   Wt 160 lb 4 oz (72.7 kg)   SpO2 93%   BMI 25.10 kg/m    Physical Exam Vitals reviewed.  Constitutional:      General: She is not in acute distress.    Appearance: Normal appearance.  HENT:     Head: Normocephalic and atraumatic.     Comments: Bilateral maxillary sinus tenderness with palpation Neck:     Vascular: No carotid bruit.  Cardiovascular:     Rate and Rhythm: Normal rate and regular rhythm.     Pulses: Normal pulses.     Heart sounds: Normal heart sounds.  Pulmonary:     Effort: Pulmonary effort is normal.     Breath sounds: Normal breath sounds.  Abdominal:     General: Abdomen is flat. Bowel sounds are normal. There is no distension.     Palpations: Abdomen is soft.     Tenderness: There is no abdominal tenderness.  Skin:    General: Skin is warm and dry.  Neurological:     General: No focal deficit present.     Mental Status: She is alert and oriented to person, place, and time.  Psychiatric:        Mood and Affect: Mood normal.        Behavior: Behavior normal.        Judgment: Judgment normal.      No results found for any visits on 10/29/23.    The  10-year ASCVD risk score (Arnett DK, et al., 2019) is: 2.3%    Assessment & Plan:   Problem List Items Addressed This Visit       Digestive   Gastroenteritis - Primary   Acute, improving Vital signs stable Overall patient appears stable, discussed collecting labs for evaluation but patient declined today.  Likely acute viral gastroenteritis.  Recommend symptom management with rest, hydration, as needed Zofran , as needed ibuprofen . If symptoms persist or do not improve by next week patient was encouraged to return to clinic for further evaluation.      Relevant Medications   ibuprofen  (ADVIL ) 600 MG tablet   ondansetron  (ZOFRAN ) 4 MG tablet     Other   Sinus pain   Reports some headache and did have tenderness with palpation to bilateral maxillary sinuses. Symptom onset 4 days ago, may be early sinusitis, likely viral at this time. Recommend treatment with ibuprofen  as needed and reach out next week if symptoms continue to persist to be evaluated for secondary bacterial infection.        Return  if symptoms worsen or fail to improve.    Zorita Hiss, NP

## 2023-11-22 ENCOUNTER — Other Ambulatory Visit: Payer: Self-pay | Admitting: Internal Medicine

## 2023-11-24 ENCOUNTER — Other Ambulatory Visit: Payer: Managed Care, Other (non HMO)

## 2023-11-24 ENCOUNTER — Other Ambulatory Visit: Payer: Self-pay | Admitting: Internal Medicine

## 2023-11-24 DIAGNOSIS — E785 Hyperlipidemia, unspecified: Secondary | ICD-10-CM

## 2023-11-24 DIAGNOSIS — E876 Hypokalemia: Secondary | ICD-10-CM

## 2023-11-24 NOTE — Progress Notes (Signed)
 Labs

## 2023-11-24 NOTE — Addendum Note (Signed)
 Addended by: Donnalyn Juran V on: 11/24/2023 09:32 AM   Modules accepted: Orders

## 2023-11-25 LAB — LIPID PANEL
Chol/HDL Ratio: 2.6 {ratio} (ref 0.0–4.4)
Cholesterol, Total: 208 mg/dL — ABNORMAL HIGH (ref 100–199)
HDL: 80 mg/dL (ref >39)
LDL Chol Calc (NIH): 116 mg/dL — ABNORMAL HIGH (ref 0–99)
Triglycerides: 68 mg/dL (ref 0–149)
VLDL Cholesterol Cal: 12 mg/dL (ref 5–40)

## 2023-11-25 LAB — COMPREHENSIVE METABOLIC PANEL
ALT: 16 [IU]/L (ref 0–32)
AST: 29 [IU]/L (ref 0–40)
Albumin: 4.5 g/dL (ref 3.8–4.9)
Alkaline Phosphatase: 100 [IU]/L (ref 44–121)
BUN/Creatinine Ratio: 10 (ref 9–23)
BUN: 8 mg/dL (ref 6–24)
Bilirubin Total: 0.3 mg/dL (ref 0.0–1.2)
CO2: 25 mmol/L (ref 20–29)
Calcium: 10.2 mg/dL (ref 8.7–10.2)
Chloride: 105 mmol/L (ref 96–106)
Creatinine, Ser: 0.77 mg/dL (ref 0.57–1.00)
Globulin, Total: 2.5 g/dL (ref 1.5–4.5)
Glucose: 100 mg/dL — ABNORMAL HIGH (ref 70–99)
Potassium: 4 mmol/L (ref 3.5–5.2)
Sodium: 143 mmol/L (ref 134–144)
Total Protein: 7 g/dL (ref 6.0–8.5)
eGFR: 92 mL/min/{1.73_m2} (ref >59)

## 2023-11-27 ENCOUNTER — Encounter: Payer: Self-pay | Admitting: Internal Medicine

## 2024-01-09 ENCOUNTER — Telehealth: Payer: Self-pay | Admitting: Internal Medicine

## 2024-01-09 NOTE — Telephone Encounter (Unsigned)
 Copied from CRM [id number]. Topic: Appointments - Appointment Scheduling >> Jan 09, 2024  8:56 AM [name] L wrote: Patient/patient representative is calling to schedule an appointment. Refer to attachments for appointment information.  Bad headache and ears are hurting and thinks she may be having a sinus infection

## 2024-01-12 ENCOUNTER — Encounter: Payer: Self-pay | Admitting: Family Medicine

## 2024-01-12 ENCOUNTER — Encounter: Payer: Self-pay | Admitting: Internal Medicine

## 2024-01-12 ENCOUNTER — Other Ambulatory Visit: Payer: Self-pay | Admitting: Family Medicine

## 2024-01-12 ENCOUNTER — Ambulatory Visit: Admitting: Family Medicine

## 2024-01-12 VITALS — BP 140/98 | HR 94 | Temp 97.6°F | Ht 67.0 in | Wt 160.0 lb

## 2024-01-12 DIAGNOSIS — H9203 Otalgia, bilateral: Secondary | ICD-10-CM | POA: Diagnosis not present

## 2024-01-12 DIAGNOSIS — I10 Essential (primary) hypertension: Secondary | ICD-10-CM

## 2024-01-12 DIAGNOSIS — R519 Headache, unspecified: Secondary | ICD-10-CM

## 2024-01-12 DIAGNOSIS — J329 Chronic sinusitis, unspecified: Secondary | ICD-10-CM

## 2024-01-12 DIAGNOSIS — B9689 Other specified bacterial agents as the cause of diseases classified elsewhere: Secondary | ICD-10-CM

## 2024-01-12 LAB — HM PAP SMEAR: HPV, high-risk: NEGATIVE

## 2024-01-12 MED ORDER — DILTIAZEM HCL ER COATED BEADS 240 MG PO CP24: 240.0000 mg | ORAL_CAPSULE | Freq: Every day | ORAL | 1 refills | Status: DC

## 2024-01-12 MED ORDER — FLUCONAZOLE 200 MG PO TABS: 200.0000 mg | ORAL_TABLET | Freq: Every day | ORAL | 0 refills | Status: DC

## 2024-01-12 MED ORDER — AMOXICILLIN-POT CLAVULANATE 875-125 MG PO TABS: 1.0000 | ORAL_TABLET | Freq: Two times a day (BID) | ORAL | 0 refills | Status: DC

## 2024-01-12 NOTE — Patient Instructions (Signed)
 Check BP at home once daily in the mornings for the next month. Follow up with me in a month to recheck blood pressures.  I have increased your diltiazem to 240 mg once daily.  I have sent in Augmentin for you to take twice a day for 10 days.  This medication can upset your stomach, so I tell everyone to take it with a meal.  I have also sent in Diflucan for you to take in case of yeast after antibiotic treatment.  If you are having symptoms when you complete antibiotics, may take 1 tablet.  If you are still having symptoms 3 days later, may take the second tablet.

## 2024-01-12 NOTE — Progress Notes (Signed)
 Acute Office Visit  Subjective:     Patient ID: [name], female    DOB: 07-25-1971, 53 y.o.   MRN: [id number]  Chief Complaint  Patient presents with   Acute Visit    Ongoing since last week, Sinus headache, ears are bothering her, blood pressure has been very elevated.    HPI Patient is in today for evaluation of headache, sinus pain and pressure for the last week. Has been taking OTC allergy with no relief. Reports blood pressures have been elevated as well. States that gynecologist mentioned this to her. States that she started checking her blood pressures at home 5 days ago and is noticing consistent mid 140s/100s. Reports compliance of medication regimen. She is taking ibuprofen almost daily. Denies vision changes, numbness, tingling, chest pain, palpitations, swelling, other concerning symptoms. Denies known sick contacts. Denies other concerns. Medical history as outlined below.  ROS Per HPI      Objective:    BP (!) 140/98 (BP Location: Left Arm, Patient Position: Sitting)   Pulse 94   Temp 97.6 F (36.4 C) (Temporal)   Ht 5\' 7"  (1.702 m)   Wt 160 lb (72.6 kg)   SpO2 96%   BMI 25.06 kg/m    Physical Exam Vitals and nursing note reviewed.  Constitutional:      General: She is not in acute distress.    Comments: Appears fatigued  HENT:     Head: Normocephalic and atraumatic.     Right Ear: External ear normal. A middle ear effusion is present. Tympanic membrane is not erythematous or bulging.     Left Ear: External ear normal. A middle ear effusion is present. Tympanic membrane is not erythematous or bulging.     Nose: No congestion.     Right Sinus: Frontal sinus tenderness present.     Left Sinus: Frontal sinus tenderness present.     Mouth/Throat:     Mouth: Mucous membranes are moist.     Pharynx: Oropharynx is clear. No oropharyngeal exudate or posterior oropharyngeal erythema.     Comments: Oropharyngeal cobblestoning   Eyes:      Extraocular Movements: Extraocular movements intact.  Cardiovascular:     Rate and Rhythm: Normal rate and regular rhythm.     Heart sounds: Normal heart sounds.  Pulmonary:     Effort: Pulmonary effort is normal. No respiratory distress.     Breath sounds: No wheezing, rhonchi or rales.  Musculoskeletal:     Cervical back: Normal range of motion and neck supple.     Right lower leg: No edema.     Left lower leg: No edema.  Lymphadenopathy:     Cervical: No cervical adenopathy.  Skin:    General: Skin is warm and dry.  Neurological:     General: No focal deficit present.     Mental Status: She is alert and oriented to person, place, and time.    No results found for any visits on 01/12/24.      Assessment & Plan:   Bacterial sinusitis -     Amoxicillin-Pot Clavulanate; Take 1 tablet by mouth 2 (two) times daily.  Dispense: 20 tablet; Refill: 0 -     Fluconazole; Take 1 tablet (200 mg total) by mouth daily.  Dispense: 2 tablet; Refill: 0  Otalgia of both ears  Essential hypertension -     dilTIAZem HCl ER Coated Beads; Take 1 capsule (240 mg total) by mouth daily.  Dispense: 30 capsule; Refill: 1  Nonintractable headache, unspecified chronicity pattern, unspecified headache type  Sinusitis vs HTN as cause Check BP at home once daily in the mornings for the next month. Follow up with me in a month to recheck blood pressures.   Meds ordered this encounter  Medications   diltiazem (CARTIA XT) 240 MG 24 hr capsule    Sig: Take 1 capsule (240 mg total) by mouth daily.    Dispense:  30 capsule    Refill:  1   amoxicillin-clavulanate (AUGMENTIN) 875-125 MG tablet    Sig: Take 1 tablet by mouth 2 (two) times daily.    Dispense:  20 tablet    Refill:  0   fluconazole (DIFLUCAN) 200 MG tablet    Sig: Take 1 tablet (200 mg total) by mouth daily.    Dispense:  2 tablet    Refill:  0    Return in about 4 weeks (around 02/09/2024) for BP med check.  Sherald Barge,  FNP

## 2024-01-14 ENCOUNTER — Ambulatory Visit: Payer: Managed Care, Other (non HMO) | Admitting: Internal Medicine

## 2024-01-22 ENCOUNTER — Ambulatory Visit: Payer: Self-pay | Admitting: Internal Medicine

## 2024-01-22 ENCOUNTER — Telehealth (INDEPENDENT_AMBULATORY_CARE_PROVIDER_SITE_OTHER): Admitting: Nurse Practitioner

## 2024-01-22 ENCOUNTER — Ambulatory Visit: Payer: Self-pay

## 2024-01-22 VITALS — BP 121/83

## 2024-01-22 DIAGNOSIS — J019 Acute sinusitis, unspecified: Secondary | ICD-10-CM

## 2024-01-22 MED ORDER — DOXYCYCLINE HYCLATE 100 MG PO TABS: 100.0000 mg | ORAL_TABLET | Freq: Two times a day (BID) | ORAL | 0 refills | Status: DC

## 2024-01-22 MED ORDER — FLUTICASONE PROPIONATE 50 MCG/ACT NA SUSP: 2.0000 | Freq: Every day | NASAL | 6 refills | Status: AC

## 2024-01-22 NOTE — Assessment & Plan Note (Signed)
 Acute Treated and failed Augmentin. We discussed doxycycline with Flonase.  She would like to try this.  Also discussed prednisone she  declines steroid use at this time due to negative side effects in the past.  Will refer to ENT due to reported history of frequent sinus infections.  Patient told if symptoms still do not improve with this treatment plan she should reach back out to this office at which point I would recommend CT scan of sinus cavities.  She reports her understanding. She is scheduled to follow-up with PCP in 1 month, she was encouraged to keep this appointment.

## 2024-01-22 NOTE — Telephone Encounter (Signed)
 Chief Complaint: Sinus pressure  Symptoms: Pressure in sinuses and ears, headache, elevated blood pressure Frequency: Not any better Pertinent Negatives: Patient denies blurred vision, chest pain, difficulty breathing  Disposition: [x] Appointment (virtual)  Additional Notes: Pt states she finished her antibiotic yesterday for the sinus infection. Pt was seen in office on 3/31 by Moshe Cipro, FNP. Pt scheduled for a virtual appointment today. This RN educated pt on home care, new-worsening symptoms, when to call back/seek emergent care. Pt verbalized understanding and agrees to plan.    Message from [place] M sent at 01/22/2024  1:29 PM EDT   Copied From CRM [id number]. Reason for Triage: Patient was seen 3/31 still not feeling better. Blood pressure # is not going down, medication was increased. Today the reading was 134/91. Please call patient [phone]    Called pt and LM on VM to call back.     Reason for Disposition  [1] Taking antibiotic > 72 hours (3 days) AND [2] sinus pain not improved  Answer Assessment - Initial Assessment Questions BLOOD PRESSURE: "What is the blood pressure?" "Did you take at least two measurements 5 minutes apart?"     141/90; 134/91 ONSET: "When did you take your blood pressure?"     A few minutes ago HOW: "How did you take your blood pressure?" (e.g., automatic home BP monitor, visiting nurse)     Automatic home BP HISTORY: "Do you have a history of high blood pressure?"     High blood pressure MEDICINES: "Are you taking any medicines for blood pressure?" "Have you missed any doses recently?"     Denies OTHER SYMPTOMS: "Do you have any symptoms?" (e.g., blurred vision, chest pain, difficulty breathing, headache, weakness)     Headache  Answer Assessment - Initial Assessment Questions Chief Complaint: Sinus pressure  Symptoms: Pressure in sinuses and ears, headache, elevated blood pressure Frequency: Not any better Pertinent Negatives:  Patient denies blurred vision, chest pain, difficulty breathing  Protocols used: Blood Pressure - High-A-AH, Sinus Infection on Antibiotic Follow-up Call-A-AH

## 2024-01-22 NOTE — Telephone Encounter (Signed)
 Message from [place] M sent at 01/22/2024  1:29 PM EDT  Copied From CRM [id number]. Reason for Triage: Patient was seen 3/31 still not feeling better. Blood pressure # is not going down, medication was increased. Today the reading was 134/91. Please call patient [phone]   Called pt and LM on VM to call back.

## 2024-01-22 NOTE — Progress Notes (Signed)
   Established Patient Office Visit  An audio/visual tele-health visit was completed today for this patient. I connected with  [name] on 01/22/24 utilizing audio/visual technology and verified that I am speaking with the correct person using two identifiers. The patient was located at their home, and I was located at the office of [facility] during the encounter. I discussed the limitations of evaluation and management by telemedicine. The patient expressed understanding and agreed to proceed.     Subjective   Patient ID: [name], female    DOB: [birth year]  Age: 53 y.o. MRN: [id number]  Chief Complaint  Patient presents with   Sinus Problem    Sinus pressure from the face to ears, some headaches, no chest congestion, no cough or body aches. Pt finished her antibiotics last night for this issue    Arrives today for follow-up on sinus infection. Symptoms have been ongoing for about 2-1/2 weeks.  She was prescribed completed a course of Augmentin but reports she continues to have sinus pressure behind her eyes as well as ear pain.  No ear discharge.  No fever.  She has been taking Tylenol for pain management as well as cetirizine.  Reports frequent sinus infections.     Review of Systems  Constitutional:  Positive for malaise/fatigue. Negative for chills and fever.  HENT:  Positive for ear pain and sinus pain. Negative for ear discharge.   Respiratory:  Negative for shortness of breath.   Cardiovascular:  Negative for chest pain.  Neurological:  Positive for headaches. Negative for dizziness.      Objective:     BP 121/83  BP Readings from Last 3 Encounters:  01/22/24 121/83  01/12/24 (!) 140/98  10/29/23 110/86   Wt Readings from Last 3 Encounters:  01/12/24 160 lb (72.6 kg)  10/29/23 160 lb 4 oz (72.7 kg)  05/07/23 161 lb (73 kg)      Physical Exam Comprehensive physical exam not completed today as office visit was  conducted remotely.  Overall patient appears well on video.  Patient was alert and oriented, and appeared to have appropriate judgment.   No results found for any visits on 01/22/24.    The 10-year ASCVD risk score (Arnett DK, et al., 2019) is: 2.6%    Assessment & Plan:   Problem List Items Addressed This Visit       Respiratory   SINUSITIS, ACUTE - Primary   Acute Treated and failed Augmentin. We discussed doxycycline with Flonase.  She would like to try this.  Also discussed prednisone she  declines steroid use at this time due to negative side effects in the past.  Will refer to ENT due to reported history of frequent sinus infections.  Patient told if symptoms still do not improve with this treatment plan she should reach back out to this office at which point I would recommend CT scan of sinus cavities.  She reports her understanding. She is scheduled to follow-up with PCP in 1 month, she was encouraged to keep this appointment.      Relevant Medications   fluticasone (FLONASE) 50 MCG/ACT nasal spray   doxycycline (VIBRA-TABS) 100 MG tablet   Other Relevant Orders   Ambulatory referral to ENT    Return if symptoms worsen or fail to improve.    Elenore Paddy, NP

## 2024-02-08 ENCOUNTER — Other Ambulatory Visit: Payer: Self-pay | Admitting: Internal Medicine

## 2024-02-10 ENCOUNTER — Encounter (INDEPENDENT_AMBULATORY_CARE_PROVIDER_SITE_OTHER): Payer: Self-pay | Admitting: Otolaryngology

## 2024-02-12 ENCOUNTER — Encounter: Payer: Self-pay | Admitting: Internal Medicine

## 2024-02-12 ENCOUNTER — Other Ambulatory Visit: Payer: Self-pay

## 2024-02-12 ENCOUNTER — Other Ambulatory Visit: Payer: Self-pay | Admitting: Internal Medicine

## 2024-02-12 DIAGNOSIS — I10 Essential (primary) hypertension: Secondary | ICD-10-CM

## 2024-02-12 MED ORDER — DILTIAZEM HCL ER COATED BEADS 240 MG PO CP24: 240.0000 mg | ORAL_CAPSULE | Freq: Every day | ORAL | 3 refills | Status: DC

## 2024-02-12 NOTE — Telephone Encounter (Signed)
 Copied from CRM [id number]. Topic: Clinical - Medication Refill >> Feb 12, 2024  7:50 AM Freya Jesus wrote: Most Recent Primary Care Visit:  Provider: Zorita Hiss  Department: [facility]  Visit Type: MYCHART VIDEO VISIT  Date: 01/22/2024  Medication: diltiazem  (CARTIA  XT) 240 MG 24 hr capsule [[id number]]  Has the patient contacted their pharmacy? Yes (Agent: If no, request that the patient contact the pharmacy for the refill. If patient does not wish to contact the pharmacy document the reason why and proceed with request.) (Agent: If yes, when and what did the pharmacy advise?) They said they would reach out to the provider.  Is this the correct pharmacy for this prescription? Yes If no, delete pharmacy and type the correct one.  This is the patient's preferred pharmacy:  [id number] - [place], [place] - [address] AT [facility] [address] [place] [address] Phone: [phone] Fax: [phone]   Has the prescription been filled recently? No  Is the patient out of the medication? Yes  Has the patient been seen for an appointment in the last year OR does the patient have an upcoming appointment? Yes  Can we respond through MyChart? Yes  Agent: Please be advised that Rx refills may take up to 3 business days. We ask that you follow-up with your pharmacy.

## 2024-02-12 NOTE — Telephone Encounter (Signed)
 Forms have been printed and filed awaiting return from PCP.

## 2024-02-17 ENCOUNTER — Ambulatory Visit (INDEPENDENT_AMBULATORY_CARE_PROVIDER_SITE_OTHER): Admitting: Internal Medicine

## 2024-02-17 ENCOUNTER — Encounter: Payer: Self-pay | Admitting: Internal Medicine

## 2024-02-17 VITALS — BP 128/84 | HR 86 | Temp 98.6°F | Ht 67.0 in | Wt 159.4 lb

## 2024-02-17 DIAGNOSIS — I10 Essential (primary) hypertension: Secondary | ICD-10-CM

## 2024-02-17 DIAGNOSIS — M5440 Lumbago with sciatica, unspecified side: Secondary | ICD-10-CM

## 2024-02-17 DIAGNOSIS — E559 Vitamin D deficiency, unspecified: Secondary | ICD-10-CM

## 2024-02-17 DIAGNOSIS — K219 Gastro-esophageal reflux disease without esophagitis: Secondary | ICD-10-CM

## 2024-02-17 DIAGNOSIS — F5102 Adjustment insomnia: Secondary | ICD-10-CM

## 2024-02-17 DIAGNOSIS — Z0000 Encounter for general adult medical examination without abnormal findings: Secondary | ICD-10-CM

## 2024-02-17 MED ORDER — METHOCARBAMOL 500 MG PO TABS: 500.0000 mg | ORAL_TABLET | Freq: Three times a day (TID) | ORAL | 0 refills | Status: DC | PRN

## 2024-02-17 MED ORDER — ESOMEPRAZOLE MAGNESIUM 40 MG PO CPDR: 40.0000 mg | DELAYED_RELEASE_CAPSULE | Freq: Every day | ORAL | 3 refills | Status: AC
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
## 2024-02-17 NOTE — Patient Instructions (Addendum)
 [employer] [facility] [place]

## 2024-02-17 NOTE — Assessment & Plan Note (Signed)
 On Vit D

## 2024-02-17 NOTE — Assessment & Plan Note (Signed)
Treat GERD w/Aciphex Continue w/lunesta prn  Potential benefits of a long term benzodiazepines  use as well as potential risks  and complications were explained to the patient and were aknowledged.

## 2024-02-17 NOTE — Assessment & Plan Note (Signed)
 Worse. On Aciphex: will use bid  Potential benefits of a long term PPI use as well as potential risks  and complications were explained to the patient and were aknowledged.

## 2024-02-17 NOTE — Assessment & Plan Note (Signed)
Continue on Diltiazem, Spironolactone

## 2024-02-17 NOTE — Assessment & Plan Note (Signed)
 Sauna Blanket Methocarbamol  Rx

## 2024-02-17 NOTE — Assessment & Plan Note (Signed)
 Continues to have LBP Methocarbamol  Rx Try a sauna blanket

## 2024-02-18 ENCOUNTER — Encounter (INDEPENDENT_AMBULATORY_CARE_PROVIDER_SITE_OTHER): Payer: Self-pay

## 2024-02-20 ENCOUNTER — Other Ambulatory Visit: Payer: Self-pay | Admitting: Internal Medicine

## 2024-04-09 ENCOUNTER — Other Ambulatory Visit: Payer: Self-pay | Admitting: Internal Medicine

## 2024-04-14 ENCOUNTER — Ambulatory Visit: Payer: Self-pay

## 2024-04-14 ENCOUNTER — Other Ambulatory Visit: Payer: Self-pay | Admitting: Internal Medicine

## 2024-04-14 NOTE — Telephone Encounter (Signed)
 Copied from CRM [id number]. Topic: Clinical - Prescription Issue >> Apr 14, 2024  3:00 PM Robinson H wrote: Reason for CRM: Danay with [facility] calling to check status for refill request for the spironolactone  (ALDACTONE ) 25 MG tablet pending since 6/29, also states a request was submitted for the Eszopiclone  3 MG TABS but shows medication is discontinued and no request, please reach back out.  Danay-[facility]  [phone]

## 2024-04-14 NOTE — Telephone Encounter (Unsigned)
 Copied from CRM [id number]. Topic: Clinical - Medication Refill >> Apr 14, 2024 11:22 AM Lavanda D wrote: Medication: Eszopiclone  3 MG TABS ALPRAZolam  (XANAX ) 1 MG tablet spironolactone  (ALDACTONE ) 25 MG tablet  Has the patient contacted their pharmacy? Yes (Agent: If no, request that the patient contact the pharmacy for the refill. If patient does not wish to contact the pharmacy document the reason why and proceed with request.) (Agent: If yes, when and what did the pharmacy advise?)  This is the patient's preferred pharmacy:  [id number] - [place], [place] - [address] AT [facility] OF [address] & [address] [place] [address] Phone: [phone] Fax: [phone]  Is this the correct pharmacy for this prescription? Yes If no, delete pharmacy and type the correct one.   Has the prescription been filled recently? Yes  Is the patient out of the medication? Yes  Has the patient been seen for an appointment in the last year OR does the patient have an upcoming appointment? Yes  Can we respond through MyChart? Yes  Agent: Please be advised that Rx refills may take up to 3 business days. We ask that you follow-up with your pharmacy.

## 2024-04-15 ENCOUNTER — Other Ambulatory Visit: Payer: Self-pay | Admitting: Internal Medicine

## 2024-04-15 MED ORDER — SPIRONOLACTONE 25 MG PO TABS: 25.0000 mg | ORAL_TABLET | Freq: Every day | ORAL | 3 refills | Status: DC

## 2024-04-15 MED ORDER — SPIRONOLACTONE 25 MG PO TABS: 25.0000 mg | ORAL_TABLET | Freq: Every day | ORAL | 3 refills | Status: AC

## 2024-04-15 MED ORDER — ESZOPICLONE 3 MG PO TABS: 3.0000 mg | ORAL_TABLET | Freq: Every day | ORAL | 1 refills | Status: AC

## 2024-04-15 NOTE — Telephone Encounter (Signed)
 Done. Thanks.

## 2024-05-03 ENCOUNTER — Ambulatory Visit: Payer: Self-pay | Admitting: *Deleted

## 2024-05-03 NOTE — Telephone Encounter (Signed)
 Reason for Disposition  [1] SEVERE pain (e.g., excruciating, unable to do any normal activities) AND [2] not improved after 2 hours of pain medicine  Answer Assessment - Initial Assessment Questions 1. LOCATION and RADIATION: Where is the pain located? Does the pain spread (shoot) anywhere else?     I fell a couple of weeks onto my left hip while walking my dogs.   My ran after a rabbit and jerked me down.  It was a hard jerk and fall.    2. QUALITY: What does the pain feel like?  (e.g., sharp, dull, aching, burning)     It's a terrible pain.   It's going down my left leg.   The pain is so bad it stops in my tracks.   I'm hurting so bad. 3. SEVERITY: How bad is the pain? What does it keep you from doing?   (Scale 1-10; or mild, moderate, severe)     Severe 4. ONSET: When did the pain start? Does it come and go, or is it there all the time?     2 weeks ago 5. WORK OR EXERCISE: Has there been any recent work or exercise that involved this part of the body?      Dog jerked me down 2 weeks ago. 6. CAUSE: What do you think is causing the hip pain?      See above 7. AGGRAVATING FACTORS: What makes the hip pain worse? (e.g., walking, climbing stairs, running)     I can walk but it hurts. 8. OTHER SYMPTOMS: Do you have any other symptoms? (e.g., back pain, pain shooting down leg,  fever, rash)     Yes it shoots down my leg.  Protocols used: Hip Pain-A-AH FYI Only or Action Required?: FYI only for provider.  Patient was last seen in primary care on 02/17/2024 by Plotnikov, Karlynn GAILS, MD.  Called Nurse Triage reporting Hip Pain. Left hip pain after a fall 2 weeks ago   Symptoms began several weeks ago.  Interventions attempted: OTC medications: ibuprofen  and heat.  Symptoms are: gradually worsening.  Triage Disposition: See Physician Within 24 Hours  Patient/caregiver understands and will follow disposition?: Yes

## 2024-05-04 ENCOUNTER — Ambulatory Visit (INDEPENDENT_AMBULATORY_CARE_PROVIDER_SITE_OTHER): Admitting: Family Medicine

## 2024-05-04 ENCOUNTER — Encounter: Payer: Self-pay | Admitting: Family Medicine

## 2024-05-04 ENCOUNTER — Ambulatory Visit (INDEPENDENT_AMBULATORY_CARE_PROVIDER_SITE_OTHER)

## 2024-05-04 VITALS — BP 118/88 | HR 79 | Temp 97.9°F | Ht 67.0 in

## 2024-05-04 DIAGNOSIS — M25552 Pain in left hip: Secondary | ICD-10-CM | POA: Diagnosis not present

## 2024-05-04 MED ORDER — HYDROCODONE-ACETAMINOPHEN 5-325 MG PO TABS: 1.0000 | ORAL_TABLET | ORAL | 0 refills | Status: DC | PRN

## 2024-05-04 NOTE — Progress Notes (Signed)
   Acute Office Visit  Subjective:     Patient ID: [name], female    DOB: 04-22-1971, 53 y.o.   MRN: [id number]  Chief Complaint  Patient presents with   Acute Visit    HPI Patient is in today for evaluation of left hip pain and low back pain with radiation down bilateral legs for 2 weeks after a fall from standing position to a grassy surface. States she was walking her dog, and the dog pulled her down. She landed on her left hip. She reports that her left hip pain has been constant since the fall. Left hip pain is worse with walking, movement, and palpation. States her back pain feels as though it originates from her hip. Pain radiates down both legs. Patient reports that she has used Tylenol , Motrin , Robaxin , heating pad, and ice packs with no improvement in pain. She denies bowel/bladder incontinence or saddle anesthesia.   ROS See HPI     Objective:    BP 118/88 (BP Location: Left Arm, Patient Position: Sitting)   Pulse 79   Temp 97.9 F (36.6 C) (Temporal)   Ht 5' 7 (1.702 m)   SpO2 96%   BMI 24.97 kg/m    Physical Exam Vitals reviewed.  Constitutional:      Appearance: Normal appearance.  HENT:     Head: Normocephalic and atraumatic.  Cardiovascular:     Rate and Rhythm: Normal rate and regular rhythm.     Pulses: Normal pulses.     Heart sounds: Normal heart sounds.  Pulmonary:     Effort: Pulmonary effort is normal.  Musculoskeletal:     Cervical back: Normal range of motion. No tenderness.     Left hip: Tenderness present.  Skin:    General: Skin is warm and dry.     Capillary Refill: Capillary refill takes less than 2 seconds.  Neurological:     Mental Status: She is alert and oriented to person, place, and time.  Psychiatric:        Mood and Affect: Mood normal.        Behavior: Behavior normal.    No results found for any visits on 05/04/24.      Assessment & Plan:   Problem List Items Addressed This Visit   None Visit Diagnoses        Left hip pain    -  Primary   Relevant Medications   HYDROcodone -acetaminophen  (NORCO/VICODIN) 5-325 MG tablet   Other Relevant Orders   DG HIP UNILAT WITH PELVIS 2-3 VIEWS LEFT (Completed)       Meds ordered this encounter  Medications   HYDROcodone -acetaminophen  (NORCO/VICODIN) 5-325 MG tablet    Sig: Take 1 tablet by mouth every 4 (four) hours as needed for moderate pain (pain score 4-6).    Dispense:  28 tablet    Refill:  0    Return if symptoms worsen or fail to improve.  Debby CHRISTELLA Borer, RN
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
## 2024-05-04 NOTE — Patient Instructions (Signed)
 We are getting an xray today. We will be in contact with any abnormal results that require further attention.

## 2024-05-06 ENCOUNTER — Ambulatory Visit: Payer: Self-pay

## 2024-05-06 ENCOUNTER — Ambulatory Visit: Payer: Self-pay | Admitting: Family Medicine

## 2024-05-06 DIAGNOSIS — M899 Disorder of bone, unspecified: Secondary | ICD-10-CM

## 2024-05-06 DIAGNOSIS — M25552 Pain in left hip: Secondary | ICD-10-CM

## 2024-05-06 NOTE — Telephone Encounter (Signed)
 FYI Only or Action Required?: Action required by provider: Pt is requesting that imaging results be reviewed, and an POC be made. Pt would like a call back today. Pain medication is not effective in controlling her hip and back pain.  Patient was last seen in primary care on 05/04/2024 by Alvia Corean CROME, FNP.  Called Nurse Triage reporting Hip Pain.  Symptoms began several days ago.  Interventions attempted: Other: seen in office, imaging was done.  Symptoms are: gradually worsening.  Triage Disposition: No disposition on file.  Patient/caregiver understands and will follow disposition?: No, wishes to speak with PCP                   Copied from CRM #[id number]. Topic: Clinical - Red Word Triage >> May 06, 2024  8:52 AM Viola F wrote: Patient seen 05/04/24, she had x-ray done and prescribed HYDROcodone -acetaminophen  - she is still in pain and has a few questions. Answer Assessment - Initial Assessment Questions 1. LOCATION and RADIATION: Where is the pain located? Does the pain spread (shoot) anywhere else?     Right hip  and back - pt fell  3. SEVERITY: How bad is the pain? What does it keep you from doing?   (Scale 1-10; or mild, moderate, severe)     severe 4. ONSET: When did the pain start? Does it come and go, or is it there all the time?     Awhile - a couple of weeks 5. WORK OR EXERCISE: Has there been any recent work or exercise that involved this part of the body?      fell 6. CAUSE: What do you think is causing the hip pain?      unsure  Protocols used: Hip Pain-A-AH

## 2024-05-07 MED ORDER — JOURNAVX 50 MG PO TABS
50.0000 mg | ORAL_TABLET | Freq: Two times a day (BID) | ORAL | 0 refills | Status: DC
Start: 2024-05-07 — End: 2024-08-19

## 2024-05-07 NOTE — Progress Notes (Signed)
 Patient notified

## 2024-05-10 ENCOUNTER — Encounter: Payer: Self-pay | Admitting: Family Medicine

## 2024-05-10 NOTE — Telephone Encounter (Signed)
 [name] prescribed Norco.  Hip x-ray was okay.  Orthopedic consultation was recommended.  I agree with the plan.  Please ask [name] what else to do if needed.  I am out of town. Thank you

## 2024-05-12 ENCOUNTER — Ambulatory Visit: Admitting: Physician Assistant

## 2024-05-20 ENCOUNTER — Encounter: Payer: Self-pay | Admitting: Physician Assistant

## 2024-05-20 ENCOUNTER — Ambulatory Visit (INDEPENDENT_AMBULATORY_CARE_PROVIDER_SITE_OTHER): Admitting: Physician Assistant

## 2024-05-20 ENCOUNTER — Other Ambulatory Visit (INDEPENDENT_AMBULATORY_CARE_PROVIDER_SITE_OTHER): Payer: Self-pay

## 2024-05-20 DIAGNOSIS — M79605 Pain in left leg: Secondary | ICD-10-CM | POA: Diagnosis not present

## 2024-05-20 DIAGNOSIS — M545 Low back pain, unspecified: Secondary | ICD-10-CM

## 2024-05-20 MED ORDER — TRAMADOL HCL 50 MG PO TABS: 50.0000 mg | ORAL_TABLET | Freq: Three times a day (TID) | ORAL | 2 refills | Status: DC | PRN

## 2024-05-20 MED ORDER — DICLOFENAC SODIUM 75 MG PO TBEC: 75.0000 mg | DELAYED_RELEASE_TABLET | Freq: Two times a day (BID) | ORAL | 2 refills | Status: DC | PRN

## 2024-05-20 NOTE — Addendum Note (Signed)
 Addended by: Sible Straley on: 05/20/2024 12:55 PM   Modules accepted: Orders
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
## 2024-05-21 ENCOUNTER — Other Ambulatory Visit: Payer: Self-pay

## 2024-05-21 DIAGNOSIS — M545 Low back pain, unspecified: Secondary | ICD-10-CM

## 2024-05-25 ENCOUNTER — Ambulatory Visit
Admission: RE | Admit: 2024-05-25 | Discharge: 2024-05-25 | Disposition: A | Discharge status: Home / Self Care | Source: Ambulatory Visit | Attending: Orthopaedic Surgery | Admitting: Orthopaedic Surgery

## 2024-05-25 DIAGNOSIS — M545 Low back pain, unspecified: Secondary | ICD-10-CM

## 2024-05-28 ENCOUNTER — Telehealth: Payer: Self-pay

## 2024-05-28 DIAGNOSIS — M5440 Lumbago with sciatica, unspecified side: Secondary | ICD-10-CM

## 2024-05-28 NOTE — Telephone Encounter (Signed)
 Pt states she was referred to the wrong ortho office or her recent fall and is wanting to know if a referral can be sent in to [facility], Dr. Burnetta who takes care of her mom and husband as well.  Pt is wanting to schedule an appointment with them ASAP and has canceled the apptmnt with [facility].

## 2024-06-01 NOTE — Telephone Encounter (Signed)
 Noted.  The referral was changed.  Please cancel the referral to see Dr.Xu Thanks

## 2024-06-01 NOTE — Addendum Note (Signed)
 Addended by: Joelee Snoke V on: 06/01/2024 07:37 AM   Modules accepted: Orders

## 2024-06-02 ENCOUNTER — Ambulatory Visit: Payer: Self-pay | Admitting: Orthopaedic Surgery

## 2024-06-02 NOTE — Progress Notes (Signed)
 F/u with megan please.  Thanks.

## 2024-06-03 ENCOUNTER — Ambulatory Visit: Admitting: Physical Medicine and Rehabilitation

## 2024-06-03 ENCOUNTER — Encounter: Payer: Self-pay | Admitting: Internal Medicine

## 2024-06-03 ENCOUNTER — Ambulatory Visit: Admitting: Internal Medicine

## 2024-06-03 ENCOUNTER — Telehealth: Payer: Self-pay

## 2024-06-03 VITALS — BP 118/84 | HR 84 | Temp 98.0°F | Ht 67.0 in | Wt 153.0 lb

## 2024-06-03 DIAGNOSIS — M545 Low back pain, unspecified: Secondary | ICD-10-CM

## 2024-06-03 DIAGNOSIS — M79605 Pain in left leg: Secondary | ICD-10-CM

## 2024-06-03 DIAGNOSIS — I10 Essential (primary) hypertension: Secondary | ICD-10-CM | POA: Diagnosis not present

## 2024-06-03 DIAGNOSIS — W1800XA Striking against unspecified object with subsequent fall, initial encounter: Secondary | ICD-10-CM

## 2024-06-03 DIAGNOSIS — M419 Scoliosis, unspecified: Secondary | ICD-10-CM | POA: Insufficient documentation

## 2024-06-03 DIAGNOSIS — M5440 Lumbago with sciatica, unspecified side: Secondary | ICD-10-CM

## 2024-06-03 DIAGNOSIS — M4104 Infantile idiopathic scoliosis, thoracic region: Secondary | ICD-10-CM

## 2024-06-03 MED ORDER — TRAMADOL HCL 50 MG PO TABS: 50.0000 mg | ORAL_TABLET | Freq: Four times a day (QID) | ORAL | 1 refills | Status: AC | PRN

## 2024-06-03 MED ORDER — ONDANSETRON HCL 4 MG PO TABS: 4.0000 mg | ORAL_TABLET | Freq: Three times a day (TID) | ORAL | 1 refills | Status: AC | PRN

## 2024-06-03 MED ORDER — METHOCARBAMOL 500 MG PO TABS: 500.0000 mg | ORAL_TABLET | Freq: Three times a day (TID) | ORAL | 0 refills | Status: AC | PRN

## 2024-06-03 NOTE — Assessment & Plan Note (Signed)
Continue on Diltiazem, Spironolactone

## 2024-06-03 NOTE — Progress Notes (Signed)
 Subjective:  Patient ID: [name], female    DOB: [birth year]  Age: 53 y.o. MRN: [id number]  CC: Referral (Patient here for discuss Leave and paperwork. Patient is also wanting a referral to [facility] Dr. Bobie. She had a fall with her dog pulling her down. Patient had xray and Ct done yesterday )   HPI [name] presents to discuss Leave and paperwork. Patient is also wanting a referral to [facility] Dr. Burnetta. She had a fall with her dog pulling her down on 7/21 or so. Patient had xray then and CT done - Dr Jerri and Ms [place], [place]. C/o LBP and severe L hip pain (10/10)...  Outpatient Medications Prior to Visit  Medication Sig Dispense Refill   ALPRAZolam  (XANAX ) 1 MG tablet TAKE 1/2 TABLET BY MOUTH TWICE DAILY AS NEEDED FOR ANXIETY. DO NOT TAKE WITH LUNESTA  90 tablet 3   amitriptyline  (ELAVIL ) 25 MG tablet TAKE 1 TO 2 TABLETS(25 TO 50 MG) BY MOUTH AT BEDTIME 180 tablet 3   aspirin 81 MG tablet Take 81 mg by mouth daily.     diltiazem  (CARTIA  XT) 240 MG 24 hr capsule Take 1 capsule (240 mg total) by mouth daily. 90 capsule 3   esomeprazole  (NEXIUM ) 40 MG capsule Take 1 capsule (40 mg total) by mouth daily. 90 capsule 3   Eszopiclone  3 MG TABS Take 1 tablet (3 mg total) by mouth at bedtime. Take immediately before bedtime 90 tablet 1   fluticasone  (FLONASE ) 50 MCG/ACT nasal spray Place 2 sprays into both nostrils daily. (Patient taking differently: Place 2 sprays into both nostrils as needed.) 16 g 6   spironolactone  (ALDACTONE ) 25 MG tablet Take 1 tablet (25 mg total) by mouth daily. 90 tablet 3   ibuprofen  (ADVIL ) 600 MG tablet Take 1 tablet (600 mg total) by mouth every 8 (eight) hours as needed. 15 tablet 0   methocarbamol  (ROBAXIN ) 500 MG tablet Take 1 tablet (500 mg total) by mouth every 8 (eight) hours as needed for muscle spasms. 120 tablet 0   ondansetron  (ZOFRAN ) 4 MG tablet Take 1 tablet (4 mg total) by mouth every 8 (eight) hours as needed for nausea or  vomiting. 40 tablet 1   traMADol  (ULTRAM ) 50 MG tablet Take 1 tablet (50 mg total) by mouth 3 (three) times daily as needed. 30 tablet 2   Cholecalciferol ([employer] D3) 50 MCG (2000 UT) CAPS Take 1 capsule (2,000 Units total) by mouth daily. 100 capsule 3   diclofenac  (VOLTAREN ) 75 MG EC tablet Take 1 tablet (75 mg total) by mouth 2 (two) times daily as needed. (Patient not taking: Reported on 06/03/2024) 60 tablet 2   doxycycline  (VIBRA -TABS) 100 MG tablet Take 1 tablet (100 mg total) by mouth 2 (two) times daily. 20 tablet 0   fish oil-omega-3 fatty acids 1000 MG capsule Take 1 g by mouth daily.     fluconazole  (DIFLUCAN ) 200 MG tablet Take 1 tablet (200 mg total) by mouth daily. (Patient not taking: Reported on 06/03/2024) 2 tablet 0   gabapentin  (NEURONTIN ) 600 MG tablet Take 0.5 tablets (300 mg total) by mouth at bedtime. Take 0.5 tablets at bedtime for 1 week, then if tolerating increase to 1 tablet at bedtime. 90 tablet 2   HYDROcodone -acetaminophen  (NORCO/VICODIN) 5-325 MG tablet Take 1 tablet by mouth every 4 (four) hours as needed for moderate pain (pain score 4-6). 28 tablet 0   lactulose  (CHRONULAC ) 10 GM/15ML solution TAKE 30 TO 60 ML BY MOUTH  TWICE DAILY AS NEEDED FOR CONSTIPATION 450 mL 3   ondansetron  (ZOFRAN ) 4 MG tablet Take 1 tablet (4 mg total) by mouth every 8 (eight) hours as needed for nausea or vomiting. (Patient not taking: Reported on 06/03/2024) 20 tablet 0   pantoprazole (PROTONIX) 40 MG tablet Take 40 mg by mouth every morning.     RABEprazole  (ACIPHEX ) 20 MG tablet TAKE 1 TABLET BY MOUTH EVERY DAY. (Patient not taking: Reported on 06/03/2024) 90 tablet 3   SUMAtriptan  (IMITREX ) 50 MG tablet Take 0.5 tablets (25 mg total) by mouth every 2 (two) hours as needed for migraine. May repeat in 2 hours if headache persists or recurs, do not take more than 3 times in 1 day without consulting a doctor. Do not exceed 10 tabs per month. 10 tablet 3   Suzetrigine  (JOURNAVX ) 50 MG TABS Take 50  mg by mouth in the morning and at bedtime. Take 100mg  (2 tabs) once, then every 12 hours take 50mg  for 2 weeks 15 tablet 0   No facility-administered medications prior to visit.    ROS: Review of Systems  Constitutional:  Positive for fatigue. Negative for activity change, appetite change, chills and unexpected weight change.  HENT:  Negative for congestion, mouth sores and sinus pressure.   Eyes:  Negative for visual disturbance.  Respiratory:  Negative for cough and chest tightness.   Gastrointestinal:  Positive for nausea and vomiting. Negative for abdominal pain.  Genitourinary:  Negative for difficulty urinating, frequency and vaginal pain.  Musculoskeletal:  Positive for arthralgias, back pain and gait problem.  Skin:  Negative for pallor and rash.  Neurological:  Negative for dizziness, tremors, weakness, numbness and headaches.  Psychiatric/Behavioral:  Positive for sleep disturbance. Negative for confusion. The patient is nervous/anxious.     Objective:  BP 118/84 (BP Location: Left Arm, Patient Position: Sitting, Cuff Size: Normal)   Pulse 84   Temp 98 F (36.7 C) (Oral)   Ht 5' 7 (1.702 m)   Wt 153 lb (69.4 kg)   SpO2 97%   BMI 23.96 kg/m   BP Readings from Last 3 Encounters:  06/03/24 118/84  05/04/24 118/88  02/17/24 128/84    Wt Readings from Last 3 Encounters:  06/03/24 153 lb (69.4 kg)  02/17/24 159 lb 6.4 oz (72.3 kg)  01/12/24 160 lb (72.6 kg)    Physical Exam Constitutional:      General: She is not in acute distress.    Appearance: She is well-developed. She is obese.  HENT:     Head: Normocephalic.     Right Ear: External ear normal.     Left Ear: External ear normal.     Nose: Nose normal.  Eyes:     General:        Right eye: No discharge.        Left eye: No discharge.     Conjunctiva/sclera: Conjunctivae normal.     Pupils: Pupils are equal, round, and reactive to light.  Neck:     Thyroid : No thyromegaly.     Vascular: No JVD.      Trachea: No tracheal deviation.  Cardiovascular:     Rate and Rhythm: Normal rate and regular rhythm.     Heart sounds: Normal heart sounds.  Pulmonary:     Effort: No respiratory distress.     Breath sounds: No stridor. No wheezing.  Abdominal:     General: Bowel sounds are normal. There is no distension.     Palpations:  Abdomen is soft. There is no mass.     Tenderness: There is no abdominal tenderness. There is no guarding or rebound.  Musculoskeletal:        General: Tenderness and deformity present.     Cervical back: Normal range of motion and neck supple. No rigidity.     Right lower leg: No edema.     Left lower leg: No edema.  Lymphadenopathy:     Cervical: No cervical adenopathy.  Skin:    Findings: No erythema or rash.  Neurological:     Mental Status: She is oriented to person, place, and time.     Cranial Nerves: No cranial nerve deficit.     Motor: No abnormal muscle tone.     Coordination: Coordination normal.     Gait: Gait abnormal.     Deep Tendon Reflexes: Reflexes normal.  Psychiatric:        Behavior: Behavior normal.        Thought Content: Thought content normal.        Judgment: Judgment normal.   Scoliosis LS and hips - tender Str leg elev (+/-) B  Lab Results  Component Value Date   WBC 7.8 07/21/2023   HGB 12.7 07/21/2023   HCT 39.3 07/21/2023   PLT 381 07/21/2023   GLUCOSE 100 (H) 11/24/2023   CHOL 208 (H) 11/24/2023   TRIG 68 11/24/2023   HDL 80 11/24/2023   LDLDIRECT 120.8 08/04/2013   LDLCALC 116 (H) 11/24/2023   ALT 16 11/24/2023   AST 29 11/24/2023   NA 143 11/24/2023   K 4.0 11/24/2023   CL 105 11/24/2023   CREATININE 0.77 11/24/2023   BUN 8 11/24/2023   CO2 25 11/24/2023   TSH 0.960 07/21/2023    CT LUMBAR SPINE WO CONTRAST Result Date: 06/02/2024 EXAM: CT OF THE LUMBAR SPINE WITHOUT CONTRAST 05/25/2024 08:40:02 AM TECHNIQUE: CT of the lumbar spine was performed without the administration of intravenous contrast.  Multiplanar reformatted images are provided for review. Automated exposure control, iterative reconstruction, and/or weight based adjustment of the mA/kV was utilized to reduce the radiation dose to as low as reasonably achievable. COMPARISON: None available. CLINICAL HISTORY: PAIN. LBP injury 1 month FINDINGS: BONES AND ALIGNMENT: Partial sacralization of L5 with right-sided assimilation. Hyperplastic ribs at T12. There is a left-sided Harrington rod terminating at L3. Left convex scoliosis with apex at L3 in the lumbar spine. Incompletely visualized upper spinal scoliosis. DEGENERATIVE CHANGES: Small disc bulge with endplate spurring at L3-4 with no spinal canal or neural foraminal stenosis. L4-5 small disc bulge with endplate spurring, no spinal canal stenosis. Mild right and moderate left foraminal stenosis. SOFT TISSUES: No acute abnormality. IMPRESSION: 1. Left convex lumbar scoliosis with apex at L3. 2. Small disc bulge with endplate spurring at L3-4 without spinal canal or neural foraminal stenosis. 3. Small disc bulge with endplate spurring at L4-5 without spinal canal stenosis. Mild right and moderate left foraminal stenosis. Electronically signed by: Franky Stanford MD 06/02/2024 12:23 PM EDT RP Workstation: [id number]    Assessment & Plan:   Problem List Items Addressed This Visit     Back pain - Primary   Worse.  She had a fall with her dog pulling her down on 7/21 or so. Patient had xray then and CT done - Dr Jerri and Ms [place], [place]. C/o LBP and severe L hip pain (10/10)... Ref to Dr Burnetta Methocarbamol  Rx Tramadol  prn      Relevant Medications   methocarbamol  (ROBAXIN )  500 MG tablet   traMADol  (ULTRAM ) 50 MG tablet   Other Relevant Orders   Ambulatory referral to Orthopedic Surgery   Essential hypertension   Continue on Diltiazem , Spironolactone       Fall against object   Worse.  She had a fall with her dog pulling her down on 7/21 or so. Patient had xray then and CT done - Dr  Jerri and Ms [place], [place]. C/o LBP and severe L hip pain (10/10)... Ref to Dr Burnetta Methocarbamol  Rx Tramadol  prn      Relevant Orders   Ambulatory referral to Orthopedic Surgery   Scoliosis   Chronic - h/o surgery  She had a fall with her dog pulling her down on 7/21 or so. Patient had xray then and CT done - Dr Jerri and Ms [place], [place]. C/o LBP and severe L hip pain (10/10)... Ref to Dr Burnetta Methocarbamol  Rx Tramadol  prn         Meds ordered this encounter  Medications   methocarbamol  (ROBAXIN ) 500 MG tablet    Sig: Take 1 tablet (500 mg total) by mouth every 8 (eight) hours as needed for muscle spasms.    Dispense:  120 tablet    Refill:  0   ondansetron  (ZOFRAN ) 4 MG tablet    Sig: Take 1 tablet (4 mg total) by mouth every 8 (eight) hours as needed for nausea or vomiting.    Dispense:  40 tablet    Refill:  1   traMADol  (ULTRAM ) 50 MG tablet    Sig: Take 1 tablet (50 mg total) by mouth every 6 (six) hours as needed.    Dispense:  120 tablet    Refill:  1      Follow-up: Return in about 3 months (around 09/03/2024) for a follow-up visit.  Marolyn Noel, MD

## 2024-06-03 NOTE — Assessment & Plan Note (Signed)
 Chronic - h/o surgery  She had a fall with her dog pulling her down on 7/21 or so. Patient had xray then and CT done - Dr Jerri and Ms [place], [place]. C/o LBP and severe L hip pain (10/10)... Ref to Dr Burnetta Methocarbamol  Rx Tramadol  prn

## 2024-06-03 NOTE — Progress Notes (Signed)
 Tried to call. No answer. LMOM that Dr.Xu wants patient to see Duwaine Pouch to review CT of L-Spine. Ask her to call us  back for scheduling.

## 2024-06-03 NOTE — Telephone Encounter (Signed)
 Tried to call. No answer. LMOM that Dr.Xu wants patient to see Duwaine Pouch to review CT of L-Spine. Ask her to call us  back for scheduling.

## 2024-06-03 NOTE — Assessment & Plan Note (Signed)
 Worse.  She had a fall with her dog pulling her down on 7/21 or so. Patient had xray then and CT done - Dr Jerri and Ms [place], [place]. C/o LBP and severe L hip pain (10/10)... Ref to Dr Burnetta Methocarbamol  Rx Tramadol  prn

## 2024-06-11 ENCOUNTER — Telehealth: Payer: Self-pay

## 2024-06-11 NOTE — Telephone Encounter (Signed)
 Pt is aware that her paperwork for her leave has been given to PCP for review and to be signed.  Once paperwork is returned pt is aware she will get a call to come to the office for her to sign the portion she is responsible for then we will fax over to her job as requested.

## 2024-06-15 NOTE — Telephone Encounter (Signed)
 Copied from CRM [id number]. Topic: General - Other >> Jun 15, 2024 10:46 AM [place] J wrote: Reason for CRM: Patient is on a mental leave of absence and is needing the paperwork she gave to [place] as soon as possible. The paperwork is for her to return back to her employer. She stated that there's still portions of the paperwork she needs to fill out herself.

## 2024-06-16 NOTE — Telephone Encounter (Signed)
 Copied from CRM [id number]. Topic: General - Other >> Jun 16, 2024 10:34 AM Jasmin G wrote: Reason for CRM: Pt states that she requested for a disability form to be filles out for her by her PCP, Mr. Solomon, and called to give clinic dates of her leave which will be from September 15th to October 15th, please call her back at [phone] when forms are ready to be filled out by her and so she can go pick them up.

## 2024-06-18 NOTE — Telephone Encounter (Signed)
 Forms have been completed, patient has a copy. Copy sent to scan

## 2024-06-23 NOTE — Telephone Encounter (Unsigned)
 Copied from CRM [id number]. Topic: Medical Record Request - Records Request >> Jun 23, 2024 10:16 AM [name] wrote: Reason for CRM: PT called in about having her paperwork for leave of absence was not received. Pt is requesting for it to be resent  to [employer]  Pls follow up with pt with phone call.

## 2024-06-25 NOTE — Telephone Encounter (Signed)
 Spoke with patient, job did not receive forms. Does not look like forms have been scanned into chart yet, will try to locate forms to refax

## 2024-06-25 NOTE — Telephone Encounter (Signed)
 LVM for patient

## 2024-06-28 NOTE — Telephone Encounter (Signed)
 Patient will bring her copies tomorrow when she brings her mom to her visit for us  to refax

## 2024-06-30 ENCOUNTER — Telehealth: Payer: Self-pay | Admitting: Radiology

## 2024-06-30 DIAGNOSIS — Z8739 Personal history of other diseases of the musculoskeletal system and connective tissue: Secondary | ICD-10-CM | POA: Insufficient documentation

## 2024-06-30 DIAGNOSIS — M545 Low back pain, unspecified: Secondary | ICD-10-CM | POA: Insufficient documentation

## 2024-06-30 NOTE — Telephone Encounter (Signed)
 Copied from CRM #[id number]. Topic: Clinical - Medical Advice >> Jun 30, 2024  1:42 PM Terri G wrote: Reason for CRM: Patient would like for Wilford to call her to help her fill out another form she received from her job and she just wants to make sure she is answering the questions correctly. Callback number [phone]

## 2024-07-01 NOTE — Telephone Encounter (Signed)
 Spoke with patient, she spoke with her job form is not needed as it was a duplicate. Nothing needed at this time

## 2024-07-09 DIAGNOSIS — R52 Pain, unspecified: Secondary | ICD-10-CM | POA: Insufficient documentation

## 2024-07-13 ENCOUNTER — Telehealth: Payer: Self-pay | Admitting: Radiology

## 2024-07-13 NOTE — Telephone Encounter (Signed)
 Copied from CRM [id number]. Topic: General - Other >> Jul 13, 2024 11:02 AM [name] wrote: Reason for CRM: Patient called in stating that she actually needs her disability paper work extended and if the MA could change the date back to November to what she originally had. Patient is requesting a call back and can be reached at [phone].

## 2024-07-15 NOTE — Telephone Encounter (Unsigned)
 Copied from CRM [id number]. Topic: General - Other >> Jul 15, 2024 11:22 AM Burnard DEL wrote: Reason for CRM: Patient  called to give an exact date of November 9th as the return date on her disability paperwork. The October 15th date can be crossed out. Please initial beside new date change of November 9th,and be resubmitted. Patient would like for Wilford to give her a phone call as soon as she gets a chance.

## 2024-07-19 NOTE — Telephone Encounter (Signed)
 Copied from CRM [id number]. Topic: General - Other >> Jul 15, 2024 11:22 AM Burnard DEL wrote: Reason for CRM: Patient  called to give an exact date of November 9th as the return date on her disability paperwork. The October 15th date can be crossed out. Please initial beside new date change of November 9th,and be resubmitted. Patient would like for Wilford to give her a phone call as soon as she gets a chance.

## 2024-07-21 ENCOUNTER — Telehealth: Payer: Self-pay

## 2024-07-21 NOTE — Telephone Encounter (Signed)
 Matter being addressed in separate telephone note

## 2024-07-21 NOTE — Telephone Encounter (Signed)
 Copied from CRM [id number]. Topic: General - Other >> Jul 21, 2024 10:58 AM Revonda D wrote: Reason for CRM: Patient  called to give an exact date of November 9th as the return date on her disability paperwork. The October 15th date can be crossed out. Please initial beside new date change of November 9th,and be resubmitted. Patient would like for Wilford to give her a phone call as soon as she gets a chance.  UPDATE: Pt is calling back to speak with Wilford in regards to this paperwork. Pt stated that she called on 10/2 and hasn't received an update.

## 2024-07-21 NOTE — Telephone Encounter (Signed)
 Spoke with patient, let her know I see notes that she has been calling requesting to speak with me since 09/30, none of those messages were sent to me. I happened to see a message in the clinical pool today relating to this so I reached out to the patient. Patient advised her job was supposed to send us  a new form to update her return to work form. Form was not received, patient provided me with the number to [employer] [phone]). Spoke with [employer], they stated that they see patients return to work date has been updated in their system, but patients case worker did not document that a form was faxed to us . Case worker has to be the one to have form faxed over to us . I was unable to speak with patients case worker. Called patient back, making her aware of this. She is reaching back out to her job, to try speaking with her case worker.

## 2024-07-26 NOTE — Telephone Encounter (Unsigned)
 Copied from CRM [id number]. Topic: General - Call Back - No Documentation >> Jul 26, 2024  9:18 AM Darshell M wrote: Reason for CRM: Patient calling to leave a message for Kya regarding disability paperwork

## 2024-07-27 NOTE — Telephone Encounter (Signed)
 Forms placed on providers desk for signature, patient made aware

## 2024-07-27 NOTE — Telephone Encounter (Signed)
 Spoke with patient, checked providers basket no forms for patient there. Looks like forms were scanned into patients chart on 10/06. Will print and fill those out.

## 2024-07-27 NOTE — Telephone Encounter (Unsigned)
 Copied from CRM [id number]. Topic: General - Other >> Jul 27, 2024  9:45 AM Mesmerise C wrote: Reason for RMF:[name] wanted to check if [name] got the paperwork filled out for her diabaility form because when she calls them it still showing pending would like a call back for an update

## 2024-08-09 ENCOUNTER — Telehealth: Payer: Self-pay

## 2024-08-09 NOTE — Telephone Encounter (Signed)
 I was able to obtain new forms and have filled them out a/w adding in the last two office notes as requested form pts job. Forms have been signed by PCP and faxed over today.

## 2024-08-09 NOTE — Telephone Encounter (Signed)
 Copied from CRM [id number]. Topic: General - Other >> Aug 09, 2024  9:34 AM [name] wrote: Reason for CRM: Patient states her disability paperwork needs to be faxed immediately.  [name] has been waiting for one month for the paperwork to be signed and faxed.  She requests a call back as soon as possible [phone].

## 2024-08-12 DIAGNOSIS — M5416 Radiculopathy, lumbar region: Secondary | ICD-10-CM | POA: Insufficient documentation

## 2024-08-16 ENCOUNTER — Encounter: Payer: Self-pay | Admitting: Radiology

## 2024-08-19 ENCOUNTER — Other Ambulatory Visit: Payer: Self-pay

## 2024-08-23 ENCOUNTER — Encounter: Admitting: Internal Medicine

## 2024-08-30 ENCOUNTER — Telehealth: Admitting: Internal Medicine

## 2024-08-30 ENCOUNTER — Encounter: Payer: Self-pay | Admitting: Internal Medicine

## 2024-08-30 DIAGNOSIS — J019 Acute sinusitis, unspecified: Secondary | ICD-10-CM | POA: Diagnosis not present

## 2024-08-30 MED ORDER — AZITHROMYCIN 250 MG PO TABS: ORAL_TABLET | ORAL | 1 refills | Status: AC

## 2024-08-30 MED ORDER — PROMETHAZINE-DM 6.25-15 MG/5ML PO SYRP: 5.0000 mL | ORAL_SOLUTION | Freq: Four times a day (QID) | ORAL | 0 refills | Status: DC | PRN

## 2024-08-30 NOTE — Patient Instructions (Signed)
 Please take all new medication as prescribed

## 2024-08-30 NOTE — Assessment & Plan Note (Signed)
Mild to mod, for antibx course zpack, cough med prn,  to f/u any worsening symptoms or concerns 

## 2024-08-30 NOTE — Progress Notes (Signed)
 Patient ID: [name], female   DOB: 22-Oct-1970, 53 y.o.   MRN: [id number]  Virtual Visit via Video Note  I connected with Madelin Johnston Mohs on 08/30/24 at  1:00 PM EST by a video enabled telemedicine application and verified that I am speaking with the correct person using two identifiers.  Location of all participants today Patient: at home Provider: at office   I discussed the limitations of evaluation and management by telemedicine and the availability of in person appointments. The patient expressed understanding and agreed to proceed.  History of Present Illness:  Here with 3 days acute onset fever, facial pain, pressure, headache, general weakness and malaise, and greenish d/c, with mild to mod ST and cough, but pt denies chest pain, wheezing, increased sob or doe, orthopnea, PND, increased LE swelling, palpitations, dizziness or syncope.    Pt denies polydipsia, polyuria, or new focal neuro s/s.    Pt denies fever, wt loss, night sweats, loss of appetite, or other constitutional symptoms   Past Medical History:  Diagnosis Date   Allergic rhinitis    Anemia    NOS   Chicken pox    Chicken pox    Dysmenorrhea    Dysuria    GERD (gastroesophageal reflux disease)    Headache(784.0)    HTN (hypertension)    Hx of migraines    Menorrhagia    Missed abortion    x 2   SVT (supraventricular tachycardia)    h/o r/t stress, no problems   Termination of pregnancy    x 1   Past Surgical History:  Procedure Laterality Date   ADENOIDECTOMY  1978   APPENDECTOMY  1992   BACK SURGERY     rod in back r/t scolosis   DILATION AND CURETTAGE OF UTERUS     HERNIA REPAIR  08/2006   umbilical   hysterocospy     SVD     x 2   TONSILLECTOMY  1978   UPPER GASTROINTESTINAL ENDOSCOPY      reports that she has never smoked. She has never used smokeless tobacco. She reports current alcohol use. She reports that she does not use drugs. family history includes Breast cancer in her  maternal grandmother; Cancer (age of onset: 89) in her father; Diabetes in her mother; Heart disease in her father; Hypertension in her father and mother; Stroke in her father and mother. Allergies  Allergen Reactions   Hydrocodone  Itching and Rash    Itching   Oxycodone Itching   Oxycodone-Acetaminophen  Rash   Prednisone Other (See Comments)    Intolerance - jittery and insomnia   Current Outpatient Medications on File Prior to Visit  Medication Sig Dispense Refill   ALPRAZolam  (XANAX ) 1 MG tablet TAKE 1/2 TABLET BY MOUTH TWICE DAILY AS NEEDED FOR ANXIETY. DO NOT TAKE WITH LUNESTA  90 tablet 3   amitriptyline  (ELAVIL ) 25 MG tablet TAKE 1 TO 2 TABLETS(25 TO 50 MG) BY MOUTH AT BEDTIME 180 tablet 3   aspirin 81 MG tablet Take 81 mg by mouth daily.     butalbital -acetaminophen -caffeine  (FIORICET) 50-325-40 MG tablet TAKE 1-2 TABLETS BY MOUTH EVERY 6 (SIX) HOURS AS NEEDED FOR HEADACHE. Oral; Duration: 15 Days     celecoxib  (CELEBREX ) 200 MG capsule 1 CAPSULE BY MOUTH TWICE DAILY     diclofenac  (VOLTAREN ) 75 MG EC tablet Take 1 tablet (75 mg total) by mouth 2 (two) times daily as needed. (Patient not taking: Reported on 06/03/2024) 60 tablet 2   diltiazem  (  CARTIA  XT) 240 MG 24 hr capsule Take 1 capsule (240 mg total) by mouth daily. 90 capsule 3   esomeprazole  (NEXIUM ) 40 MG capsule Take 1 capsule (40 mg total) by mouth daily. 90 capsule 3   Eszopiclone  3 MG TABS Take 1 tablet (3 mg total) by mouth at bedtime. Take immediately before bedtime 90 tablet 1   fluconazole  (DIFLUCAN ) 200 MG tablet Take 1 tablet (200 mg total) by mouth daily. (Patient not taking: Reported on 06/03/2024) 2 tablet 0   fluticasone  (FLONASE ) 50 MCG/ACT nasal spray Place 2 sprays into both nostrils daily. (Patient taking differently: Place 2 sprays into both nostrils as needed.) 16 g 6   ibuprofen  (ADVIL ) 600 MG tablet TAKE 1 TABLET BY MOUTH EVERY 6 HOURS AFTER A MEAL AS NEEDED FOR PAIN     lidocaine  (LIDODERM ) 5 % APPLY 1 PATCH  BY TOPICAL ROUTE ONCE DAILY (MAY WEAR UP TO 12HOURS.)     methocarbamol  (ROBAXIN ) 500 MG tablet Take 1 tablet (500 mg total) by mouth every 8 (eight) hours as needed for muscle spasms. 120 tablet 0   ondansetron  (ZOFRAN ) 4 MG tablet Take 1 tablet (4 mg total) by mouth every 8 (eight) hours as needed for nausea or vomiting. (Patient not taking: Reported on 06/03/2024) 20 tablet 0   ondansetron  (ZOFRAN ) 4 MG tablet Take 1 tablet (4 mg total) by mouth every 8 (eight) hours as needed for nausea or vomiting. 40 tablet 1   RABEprazole  (ACIPHEX ) 20 MG tablet TAKE 1 TABLET BY MOUTH EVERY DAY. (Patient not taking: Reported on 06/03/2024) 90 tablet 3   spironolactone  (ALDACTONE ) 25 MG tablet Take 1 tablet (25 mg total) by mouth daily. 90 tablet 3   traMADol  (ULTRAM ) 50 MG tablet Take 1 tablet (50 mg total) by mouth every 6 (six) hours as needed. 120 tablet 1   No current facility-administered medications on file prior to visit.    Observations/Objective: Alert, NAD, appropriate mood and affect, resps normal, cn 2-12 intact, moves all 4s, no visible rash or swelling Lab Results  Component Value Date   WBC 7.8 07/21/2023   HGB 12.7 07/21/2023   HCT 39.3 07/21/2023   PLT 381 07/21/2023   GLUCOSE 100 (H) 11/24/2023   CHOL 208 (H) 11/24/2023   TRIG 68 11/24/2023   HDL 80 11/24/2023   LDLDIRECT 120.8 08/04/2013   LDLCALC 116 (H) 11/24/2023   ALT 16 11/24/2023   AST 29 11/24/2023   NA 143 11/24/2023   K 4.0 11/24/2023   CL 105 11/24/2023   CREATININE 0.77 11/24/2023   BUN 8 11/24/2023   CO2 25 11/24/2023   TSH 0.960 07/21/2023   Assessment and Plan: See notes  Follow Up Instructions: See notes   I discussed the assessment and treatment plan with the patient. The patient was provided an opportunity to ask questions and all were answered. The patient agreed with the plan and demonstrated an understanding of the instructions.   The patient was advised to call back or seek an in-person evaluation  if the symptoms worsen or if the condition fails to improve as anticipated.   Lynwood Rush, MD

## 2024-09-07 ENCOUNTER — Encounter: Admitting: Internal Medicine

## 2024-09-21 ENCOUNTER — Ambulatory Visit: Admitting: Internal Medicine

## 2024-10-13 ENCOUNTER — Other Ambulatory Visit: Payer: Self-pay | Admitting: Internal Medicine

## 2024-10-13 DIAGNOSIS — I10 Essential (primary) hypertension: Secondary | ICD-10-CM

## 2024-10-17 ENCOUNTER — Other Ambulatory Visit: Payer: Self-pay | Admitting: Internal Medicine
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
## 2024-10-28 ENCOUNTER — Ambulatory Visit (HOSPITAL_BASED_OUTPATIENT_CLINIC_OR_DEPARTMENT_OTHER): Attending: Physician Assistant | Admitting: Physical Therapy

## 2024-11-02 ENCOUNTER — Ambulatory Visit: Admitting: Family Medicine

## 2024-11-02 ENCOUNTER — Encounter: Payer: Self-pay | Admitting: Family Medicine

## 2024-11-02 VITALS — BP 138/84 | Temp 98.0°F | Ht 67.0 in | Wt 153.0 lb

## 2024-11-02 DIAGNOSIS — J019 Acute sinusitis, unspecified: Secondary | ICD-10-CM | POA: Diagnosis not present

## 2024-11-02 DIAGNOSIS — R051 Acute cough: Secondary | ICD-10-CM

## 2024-11-02 DIAGNOSIS — B9689 Other specified bacterial agents as the cause of diseases classified elsewhere: Secondary | ICD-10-CM | POA: Diagnosis not present

## 2024-11-02 DIAGNOSIS — H6591 Unspecified nonsuppurative otitis media, right ear: Secondary | ICD-10-CM

## 2024-11-02 MED ORDER — PROMETHAZINE HCL 6.25 MG/5ML PO SOLN: 12.5000 mg | Freq: Four times a day (QID) | ORAL | 0 refills | Status: AC | PRN

## 2024-11-02 MED ORDER — AMOXICILLIN-POT CLAVULANATE 875-125 MG PO TABS: 1.0000 | ORAL_TABLET | Freq: Two times a day (BID) | ORAL | 0 refills | Status: AC

## 2024-11-02 MED ORDER — FLUCONAZOLE 150 MG PO TABS: ORAL_TABLET | ORAL | 0 refills | Status: AC

## 2024-11-02 NOTE — Patient Instructions (Signed)
 I have sent in Augmentin for you to take twice a day for 7 days.  This medication can upset your stomach, so I tell everyone to take it with a meal.  I have sent in hydrocodone cough syrup for you to take 5 mL once daily in the evening as needed for cough.  This medication may make you sleepy.  Do not drive or operate heavy machinery while taking this medication.  Follow-up with me for new or worsening symptoms.

## 2024-11-02 NOTE — Progress Notes (Signed)
 1 issue was sick   Acute Office Visit  Subjective:     Patient ID: [name], female    DOB: 1971/01/17, 54 y.o.   MRN: [id number]  No chief complaint on file.   HPI  Discussed the use of AI scribe software for clinical note transcription with the patient, who gave verbal consent to proceed.  History of Present Illness [name] is a 54 year old female who presents with cough, wheezing, and ear pain.  Respiratory symptoms - Cough and wheezing began last Thursday, worse at night - Chest pain with coughing - Nyquil provided no relief - Avoiding most cough medications due to concern for elevated blood pressure and itching from codeine - Requesting medication for cough and sleep that will not affect blood pressure - Husband and daughter are also ill - Grandson recently diagnosed with ear infection and RSV  Otolaryngologic symptoms - Bilateral ear pain, worse on the left - Head pressure  Constitutional symptoms - Body aches, chills, fatigue, and nausea since Thursday - Poor sleep due to cough - Unable to work due to fatigue and frequent coughing and spitting up - Concerned about when she can return to work     ROS Per HPI      Objective:    BP 138/84 (BP Location: Left Arm, Patient Position: Sitting)   Temp 98 F (36.7 C) (Temporal)   Ht 5' 7 (1.702 m)   Wt 153 lb (69.4 kg)   BMI 23.96 kg/m    Physical Exam Vitals and nursing note reviewed.  Constitutional:      General: She is not in acute distress.    Appearance: She is normal weight. She is ill-appearing.  HENT:     Head: Normocephalic and atraumatic.     Right Ear: External ear normal. A middle ear effusion is present. Tympanic membrane is erythematous and bulging.     Left Ear: External ear normal. A middle ear effusion is present. Tympanic membrane is not erythematous or bulging.     Nose:     Right Sinus: Frontal sinus tenderness present.     Left Sinus: Frontal sinus tenderness  present.     Mouth/Throat:     Mouth: Mucous membranes are moist.     Pharynx: Oropharynx is clear.  Eyes:     Extraocular Movements: Extraocular movements intact.     Pupils: Pupils are equal, round, and reactive to light.  Cardiovascular:     Rate and Rhythm: Normal rate and regular rhythm.     Pulses: Normal pulses.     Heart sounds: Normal heart sounds.  Pulmonary:     Effort: Pulmonary effort is normal. No respiratory distress.     Breath sounds: Normal breath sounds. No wheezing, rhonchi or rales.  Musculoskeletal:        General: Normal range of motion.     Cervical back: Normal range of motion.     Right lower leg: No edema.     Left lower leg: No edema.  Lymphadenopathy:     Cervical: Cervical adenopathy present.  Neurological:     General: No focal deficit present.     Mental Status: She is alert and oriented to person, place, and time.  Psychiatric:        Mood and Affect: Mood normal.        Thought Content: Thought content normal.     No results found for any visits on 11/02/24.      Assessment &  Plan:   Assessment and Plan Assessment & Plan Acute bacterial sinusitis with right otitis media and acute cough Acute bacterial sinusitis with right otitis media. Cough worse at night, associated with nausea. No current wheezing, but history reported. - Prescribed Augmentin  for sinusitis and otitis media. - Prescribed promethazine  syrup for cough and nausea. - Prescribed Diflucan  for potential yeast infection with antibiotic use - Advised to eat with Augmentin  to prevent stomach upset. - Encouraged plenty of fluids.        Orders Placed This Encounter  Procedures   POC COVID-19 BinaxNow   POCT Influenza A/B     Meds ordered this encounter  Medications   amoxicillin -clavulanate (AUGMENTIN ) 875-125 MG tablet    Sig: Take 1 tablet by mouth 2 (two) times daily for 7 days.    Dispense:  14 tablet    Refill:  0   promethazine  (PHENERGAN ) 6.25 MG/5ML  solution    Sig: Take 10 mLs (12.5 mg total) by mouth every 6 (six) hours as needed for nausea or vomiting.    Dispense:  120 mL    Refill:  0   fluconazole  (DIFLUCAN ) 150 MG tablet    Sig: Take one tablet at the onset of symptoms, if still having symptoms in 3 days, take the second tablet.    Dispense:  2 tablet    Refill:  0    Return if symptoms worsen or fail to improve.  Corean LITTIE Ku, FNP

## 2024-11-03 ENCOUNTER — Ambulatory Visit: Admitting: Internal Medicine

## 2024-11-04 ENCOUNTER — Ambulatory Visit (HOSPITAL_BASED_OUTPATIENT_CLINIC_OR_DEPARTMENT_OTHER): Admitting: Physical Therapy

## 2024-11-09 ENCOUNTER — Encounter: Admitting: Internal Medicine

## 2024-11-09 LAB — POCT INFLUENZA A/B
Influenza A, POC: NEGATIVE
Influenza B, POC: NEGATIVE

## 2024-11-09 LAB — POC COVID19 BINAXNOW: SARS Coronavirus 2 Ag: NEGATIVE

## 2024-11-11 ENCOUNTER — Ambulatory Visit (HOSPITAL_BASED_OUTPATIENT_CLINIC_OR_DEPARTMENT_OTHER): Admitting: Physical Therapy

## 2024-11-17 ENCOUNTER — Encounter (HOSPITAL_BASED_OUTPATIENT_CLINIC_OR_DEPARTMENT_OTHER): Payer: Self-pay | Admitting: Physical Therapy

## 2024-11-19 ENCOUNTER — Other Ambulatory Visit: Payer: Self-pay

## 2024-11-19 ENCOUNTER — Encounter (HOSPITAL_BASED_OUTPATIENT_CLINIC_OR_DEPARTMENT_OTHER): Payer: Self-pay | Admitting: Physical Therapy

## 2024-11-19 ENCOUNTER — Ambulatory Visit (HOSPITAL_BASED_OUTPATIENT_CLINIC_OR_DEPARTMENT_OTHER): Admitting: Physical Therapy

## 2024-11-19 DIAGNOSIS — M6281 Muscle weakness (generalized): Secondary | ICD-10-CM

## 2024-11-19 DIAGNOSIS — M5459 Other low back pain: Secondary | ICD-10-CM

## 2024-11-19 DIAGNOSIS — R293 Abnormal posture: Secondary | ICD-10-CM
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
## 2024-11-23 ENCOUNTER — Ambulatory Visit (HOSPITAL_BASED_OUTPATIENT_CLINIC_OR_DEPARTMENT_OTHER): Payer: Self-pay | Admitting: Physical Therapy

## 2024-12-15 ENCOUNTER — Ambulatory Visit (HOSPITAL_BASED_OUTPATIENT_CLINIC_OR_DEPARTMENT_OTHER): Payer: Self-pay | Admitting: Physical Therapy

## 2024-12-17 ENCOUNTER — Ambulatory Visit (HOSPITAL_BASED_OUTPATIENT_CLINIC_OR_DEPARTMENT_OTHER): Admitting: Physical Therapy
